# Patient Record
Sex: Male | Born: 1945 | Race: White | Hispanic: No | Marital: Married | Smoking: Never smoker
Health system: Southern US, Community
[De-identification: ages and names within clinical notes are randomized; demographics above are authoritative.]

## PROBLEM LIST (undated history)

## (undated) DIAGNOSIS — M1990 Unspecified osteoarthritis, unspecified site: Secondary | ICD-10-CM

## (undated) DIAGNOSIS — I10 Essential (primary) hypertension: Secondary | ICD-10-CM

## (undated) DIAGNOSIS — N400 Enlarged prostate without lower urinary tract symptoms: Secondary | ICD-10-CM

## (undated) DIAGNOSIS — E119 Type 2 diabetes mellitus without complications: Secondary | ICD-10-CM

## (undated) DIAGNOSIS — I77810 Thoracic aortic ectasia: Secondary | ICD-10-CM

## (undated) DIAGNOSIS — E785 Hyperlipidemia, unspecified: Secondary | ICD-10-CM

## (undated) DIAGNOSIS — K449 Diaphragmatic hernia without obstruction or gangrene: Secondary | ICD-10-CM

## (undated) DIAGNOSIS — I5030 Unspecified diastolic (congestive) heart failure: Secondary | ICD-10-CM

## (undated) DIAGNOSIS — I2510 Atherosclerotic heart disease of native coronary artery without angina pectoris: Secondary | ICD-10-CM

## (undated) DIAGNOSIS — I209 Angina pectoris, unspecified: Secondary | ICD-10-CM

## (undated) DIAGNOSIS — E1142 Type 2 diabetes mellitus with diabetic polyneuropathy: Secondary | ICD-10-CM

## (undated) DIAGNOSIS — I700 Atherosclerosis of aorta: Secondary | ICD-10-CM

## (undated) DIAGNOSIS — Z7982 Long term (current) use of aspirin: Secondary | ICD-10-CM

## (undated) DIAGNOSIS — R0602 Shortness of breath: Secondary | ICD-10-CM

## (undated) DIAGNOSIS — Z789 Other specified health status: Secondary | ICD-10-CM

## (undated) DIAGNOSIS — A419 Sepsis, unspecified organism: Secondary | ICD-10-CM

## (undated) HISTORY — PX: KNEE SURGERY: SHX244

## (undated) HISTORY — PX: MUSCLE REPAIR: SHX867

## (undated) HISTORY — PX: OTHER SURGICAL HISTORY: SHX169

## (undated) HISTORY — PX: CATARACT EXTRACTION, BILATERAL: SHX1313

## (undated) HISTORY — PX: BACK SURGERY: SHX140

## (undated) HISTORY — PX: COLONOSCOPY: SHX174

---

## 2009-03-26 DEATH — deceased

## 2011-06-28 DIAGNOSIS — M7530 Calcific tendinitis of unspecified shoulder: Secondary | ICD-10-CM | POA: Diagnosis not present

## 2011-07-28 DIAGNOSIS — S43429A Sprain of unspecified rotator cuff capsule, initial encounter: Secondary | ICD-10-CM | POA: Diagnosis not present

## 2011-08-02 DIAGNOSIS — M7530 Calcific tendinitis of unspecified shoulder: Secondary | ICD-10-CM | POA: Diagnosis not present

## 2011-08-04 DIAGNOSIS — M7530 Calcific tendinitis of unspecified shoulder: Secondary | ICD-10-CM | POA: Diagnosis not present

## 2011-08-09 DIAGNOSIS — M7530 Calcific tendinitis of unspecified shoulder: Secondary | ICD-10-CM | POA: Diagnosis not present

## 2011-08-11 DIAGNOSIS — M7530 Calcific tendinitis of unspecified shoulder: Secondary | ICD-10-CM | POA: Diagnosis not present

## 2011-08-14 DIAGNOSIS — M7530 Calcific tendinitis of unspecified shoulder: Secondary | ICD-10-CM | POA: Diagnosis not present

## 2011-10-03 DIAGNOSIS — H40019 Open angle with borderline findings, low risk, unspecified eye: Secondary | ICD-10-CM | POA: Diagnosis not present

## 2011-12-04 DIAGNOSIS — R5381 Other malaise: Secondary | ICD-10-CM | POA: Diagnosis not present

## 2011-12-04 DIAGNOSIS — R5383 Other fatigue: Secondary | ICD-10-CM | POA: Diagnosis not present

## 2011-12-04 DIAGNOSIS — E119 Type 2 diabetes mellitus without complications: Secondary | ICD-10-CM | POA: Diagnosis not present

## 2011-12-04 DIAGNOSIS — E039 Hypothyroidism, unspecified: Secondary | ICD-10-CM | POA: Diagnosis not present

## 2011-12-04 DIAGNOSIS — E782 Mixed hyperlipidemia: Secondary | ICD-10-CM | POA: Diagnosis not present

## 2011-12-11 DIAGNOSIS — E119 Type 2 diabetes mellitus without complications: Secondary | ICD-10-CM | POA: Diagnosis not present

## 2011-12-11 DIAGNOSIS — N529 Male erectile dysfunction, unspecified: Secondary | ICD-10-CM | POA: Diagnosis not present

## 2011-12-11 DIAGNOSIS — E782 Mixed hyperlipidemia: Secondary | ICD-10-CM | POA: Diagnosis not present

## 2011-12-11 DIAGNOSIS — L719 Rosacea, unspecified: Secondary | ICD-10-CM | POA: Diagnosis not present

## 2012-03-27 DIAGNOSIS — E119 Type 2 diabetes mellitus without complications: Secondary | ICD-10-CM | POA: Diagnosis not present

## 2012-03-27 DIAGNOSIS — H40019 Open angle with borderline findings, low risk, unspecified eye: Secondary | ICD-10-CM | POA: Diagnosis not present

## 2012-03-27 DIAGNOSIS — H2589 Other age-related cataract: Secondary | ICD-10-CM | POA: Diagnosis not present

## 2012-04-17 DIAGNOSIS — E291 Testicular hypofunction: Secondary | ICD-10-CM | POA: Diagnosis not present

## 2012-05-30 DIAGNOSIS — E119 Type 2 diabetes mellitus without complications: Secondary | ICD-10-CM | POA: Diagnosis not present

## 2012-05-30 DIAGNOSIS — R5381 Other malaise: Secondary | ICD-10-CM | POA: Diagnosis not present

## 2012-05-30 DIAGNOSIS — E782 Mixed hyperlipidemia: Secondary | ICD-10-CM | POA: Diagnosis not present

## 2012-06-06 DIAGNOSIS — E119 Type 2 diabetes mellitus without complications: Secondary | ICD-10-CM | POA: Diagnosis not present

## 2012-06-06 DIAGNOSIS — L719 Rosacea, unspecified: Secondary | ICD-10-CM | POA: Diagnosis not present

## 2012-06-06 DIAGNOSIS — E782 Mixed hyperlipidemia: Secondary | ICD-10-CM | POA: Diagnosis not present

## 2012-06-06 DIAGNOSIS — N529 Male erectile dysfunction, unspecified: Secondary | ICD-10-CM | POA: Diagnosis not present

## 2012-11-14 DIAGNOSIS — N529 Male erectile dysfunction, unspecified: Secondary | ICD-10-CM | POA: Diagnosis not present

## 2012-11-14 DIAGNOSIS — E782 Mixed hyperlipidemia: Secondary | ICD-10-CM | POA: Diagnosis not present

## 2012-11-14 DIAGNOSIS — L719 Rosacea, unspecified: Secondary | ICD-10-CM | POA: Diagnosis not present

## 2012-11-14 DIAGNOSIS — E119 Type 2 diabetes mellitus without complications: Secondary | ICD-10-CM | POA: Diagnosis not present

## 2012-11-19 DIAGNOSIS — E782 Mixed hyperlipidemia: Secondary | ICD-10-CM | POA: Diagnosis not present

## 2012-11-26 DIAGNOSIS — L719 Rosacea, unspecified: Secondary | ICD-10-CM | POA: Diagnosis not present

## 2012-11-26 DIAGNOSIS — N529 Male erectile dysfunction, unspecified: Secondary | ICD-10-CM | POA: Diagnosis not present

## 2012-11-26 DIAGNOSIS — E782 Mixed hyperlipidemia: Secondary | ICD-10-CM | POA: Diagnosis not present

## 2012-11-26 DIAGNOSIS — E119 Type 2 diabetes mellitus without complications: Secondary | ICD-10-CM | POA: Diagnosis not present

## 2013-04-04 DIAGNOSIS — L821 Other seborrheic keratosis: Secondary | ICD-10-CM | POA: Diagnosis not present

## 2013-04-04 DIAGNOSIS — D485 Neoplasm of uncertain behavior of skin: Secondary | ICD-10-CM | POA: Diagnosis not present

## 2013-04-04 DIAGNOSIS — L570 Actinic keratosis: Secondary | ICD-10-CM | POA: Diagnosis not present

## 2013-04-04 DIAGNOSIS — D2330 Other benign neoplasm of skin of unspecified part of face: Secondary | ICD-10-CM | POA: Diagnosis not present

## 2013-04-04 DIAGNOSIS — D1801 Hemangioma of skin and subcutaneous tissue: Secondary | ICD-10-CM | POA: Diagnosis not present

## 2013-04-04 DIAGNOSIS — D2360 Other benign neoplasm of skin of unspecified upper limb, including shoulder: Secondary | ICD-10-CM | POA: Diagnosis not present

## 2013-04-04 DIAGNOSIS — D235 Other benign neoplasm of skin of trunk: Secondary | ICD-10-CM | POA: Diagnosis not present

## 2013-04-18 DIAGNOSIS — Z23 Encounter for immunization: Secondary | ICD-10-CM | POA: Diagnosis not present

## 2013-05-21 DIAGNOSIS — E119 Type 2 diabetes mellitus without complications: Secondary | ICD-10-CM | POA: Diagnosis not present

## 2013-05-21 DIAGNOSIS — E782 Mixed hyperlipidemia: Secondary | ICD-10-CM | POA: Diagnosis not present

## 2013-05-21 DIAGNOSIS — R5381 Other malaise: Secondary | ICD-10-CM | POA: Diagnosis not present

## 2013-05-29 DIAGNOSIS — N529 Male erectile dysfunction, unspecified: Secondary | ICD-10-CM | POA: Diagnosis not present

## 2013-05-29 DIAGNOSIS — E119 Type 2 diabetes mellitus without complications: Secondary | ICD-10-CM | POA: Diagnosis not present

## 2013-05-29 DIAGNOSIS — L719 Rosacea, unspecified: Secondary | ICD-10-CM | POA: Diagnosis not present

## 2013-05-29 DIAGNOSIS — E782 Mixed hyperlipidemia: Secondary | ICD-10-CM | POA: Diagnosis not present

## 2013-06-06 DIAGNOSIS — R059 Cough, unspecified: Secondary | ICD-10-CM | POA: Diagnosis not present

## 2013-06-06 DIAGNOSIS — J019 Acute sinusitis, unspecified: Secondary | ICD-10-CM | POA: Diagnosis not present

## 2013-09-08 DIAGNOSIS — H2510 Age-related nuclear cataract, unspecified eye: Secondary | ICD-10-CM | POA: Diagnosis not present

## 2013-09-08 DIAGNOSIS — H40019 Open angle with borderline findings, low risk, unspecified eye: Secondary | ICD-10-CM | POA: Diagnosis not present

## 2013-09-08 DIAGNOSIS — E119 Type 2 diabetes mellitus without complications: Secondary | ICD-10-CM | POA: Diagnosis not present

## 2013-12-03 DIAGNOSIS — R5381 Other malaise: Secondary | ICD-10-CM | POA: Diagnosis not present

## 2013-12-03 DIAGNOSIS — E119 Type 2 diabetes mellitus without complications: Secondary | ICD-10-CM | POA: Diagnosis not present

## 2013-12-03 DIAGNOSIS — R5383 Other fatigue: Secondary | ICD-10-CM | POA: Diagnosis not present

## 2013-12-03 DIAGNOSIS — N529 Male erectile dysfunction, unspecified: Secondary | ICD-10-CM | POA: Diagnosis not present

## 2013-12-03 DIAGNOSIS — E782 Mixed hyperlipidemia: Secondary | ICD-10-CM | POA: Diagnosis not present

## 2013-12-10 DIAGNOSIS — E119 Type 2 diabetes mellitus without complications: Secondary | ICD-10-CM | POA: Diagnosis not present

## 2013-12-10 DIAGNOSIS — N529 Male erectile dysfunction, unspecified: Secondary | ICD-10-CM | POA: Diagnosis not present

## 2013-12-10 DIAGNOSIS — L719 Rosacea, unspecified: Secondary | ICD-10-CM | POA: Diagnosis not present

## 2013-12-10 DIAGNOSIS — G4700 Insomnia, unspecified: Secondary | ICD-10-CM | POA: Diagnosis not present

## 2013-12-10 DIAGNOSIS — E782 Mixed hyperlipidemia: Secondary | ICD-10-CM | POA: Diagnosis not present

## 2014-05-25 DIAGNOSIS — E119 Type 2 diabetes mellitus without complications: Secondary | ICD-10-CM | POA: Diagnosis not present

## 2014-05-25 DIAGNOSIS — R5383 Other fatigue: Secondary | ICD-10-CM | POA: Diagnosis not present

## 2014-05-25 DIAGNOSIS — E782 Mixed hyperlipidemia: Secondary | ICD-10-CM | POA: Diagnosis not present

## 2014-06-01 DIAGNOSIS — E782 Mixed hyperlipidemia: Secondary | ICD-10-CM | POA: Diagnosis not present

## 2014-06-01 DIAGNOSIS — L719 Rosacea, unspecified: Secondary | ICD-10-CM | POA: Diagnosis not present

## 2014-06-01 DIAGNOSIS — E1165 Type 2 diabetes mellitus with hyperglycemia: Secondary | ICD-10-CM | POA: Diagnosis not present

## 2014-06-01 DIAGNOSIS — Z1389 Encounter for screening for other disorder: Secondary | ICD-10-CM | POA: Diagnosis not present

## 2014-08-04 DIAGNOSIS — Z23 Encounter for immunization: Secondary | ICD-10-CM | POA: Diagnosis not present

## 2014-08-14 DIAGNOSIS — R935 Abnormal findings on diagnostic imaging of other abdominal regions, including retroperitoneum: Secondary | ICD-10-CM | POA: Diagnosis not present

## 2014-08-14 DIAGNOSIS — R109 Unspecified abdominal pain: Secondary | ICD-10-CM | POA: Diagnosis not present

## 2014-08-14 DIAGNOSIS — M545 Low back pain: Secondary | ICD-10-CM | POA: Diagnosis not present

## 2014-08-14 DIAGNOSIS — N4289 Other specified disorders of prostate: Secondary | ICD-10-CM | POA: Diagnosis not present

## 2014-08-17 DIAGNOSIS — M545 Low back pain: Secondary | ICD-10-CM | POA: Diagnosis not present

## 2014-08-17 DIAGNOSIS — N200 Calculus of kidney: Secondary | ICD-10-CM | POA: Diagnosis not present

## 2014-09-08 DIAGNOSIS — M5136 Other intervertebral disc degeneration, lumbar region: Secondary | ICD-10-CM | POA: Diagnosis not present

## 2014-09-08 DIAGNOSIS — S3991XA Unspecified injury of abdomen, initial encounter: Secondary | ICD-10-CM | POA: Diagnosis not present

## 2014-09-08 DIAGNOSIS — M25551 Pain in right hip: Secondary | ICD-10-CM | POA: Diagnosis not present

## 2014-09-08 DIAGNOSIS — N419 Inflammatory disease of prostate, unspecified: Secondary | ICD-10-CM | POA: Diagnosis not present

## 2014-09-11 DIAGNOSIS — H40023 Open angle with borderline findings, high risk, bilateral: Secondary | ICD-10-CM | POA: Diagnosis not present

## 2014-09-11 DIAGNOSIS — H2513 Age-related nuclear cataract, bilateral: Secondary | ICD-10-CM | POA: Diagnosis not present

## 2014-09-11 DIAGNOSIS — E119 Type 2 diabetes mellitus without complications: Secondary | ICD-10-CM | POA: Diagnosis not present

## 2014-09-23 DIAGNOSIS — L209 Atopic dermatitis, unspecified: Secondary | ICD-10-CM | POA: Diagnosis not present

## 2014-10-05 DIAGNOSIS — M25551 Pain in right hip: Secondary | ICD-10-CM | POA: Diagnosis not present

## 2014-10-05 DIAGNOSIS — S3991XA Unspecified injury of abdomen, initial encounter: Secondary | ICD-10-CM | POA: Diagnosis not present

## 2014-10-05 DIAGNOSIS — N419 Inflammatory disease of prostate, unspecified: Secondary | ICD-10-CM | POA: Diagnosis not present

## 2014-11-17 DIAGNOSIS — H40023 Open angle with borderline findings, high risk, bilateral: Secondary | ICD-10-CM | POA: Diagnosis not present

## 2014-11-25 DIAGNOSIS — R5383 Other fatigue: Secondary | ICD-10-CM | POA: Diagnosis not present

## 2014-11-25 DIAGNOSIS — E782 Mixed hyperlipidemia: Secondary | ICD-10-CM | POA: Diagnosis not present

## 2014-11-25 DIAGNOSIS — E119 Type 2 diabetes mellitus without complications: Secondary | ICD-10-CM | POA: Diagnosis not present

## 2014-12-09 DIAGNOSIS — E782 Mixed hyperlipidemia: Secondary | ICD-10-CM | POA: Diagnosis not present

## 2014-12-09 DIAGNOSIS — L719 Rosacea, unspecified: Secondary | ICD-10-CM | POA: Diagnosis not present

## 2014-12-09 DIAGNOSIS — E1165 Type 2 diabetes mellitus with hyperglycemia: Secondary | ICD-10-CM | POA: Diagnosis not present

## 2015-04-14 DIAGNOSIS — M25512 Pain in left shoulder: Secondary | ICD-10-CM | POA: Diagnosis not present

## 2015-05-14 DIAGNOSIS — M25412 Effusion, left shoulder: Secondary | ICD-10-CM | POA: Diagnosis not present

## 2015-05-14 DIAGNOSIS — M25512 Pain in left shoulder: Secondary | ICD-10-CM | POA: Diagnosis not present

## 2015-05-14 DIAGNOSIS — M75112 Incomplete rotator cuff tear or rupture of left shoulder, not specified as traumatic: Secondary | ICD-10-CM | POA: Diagnosis not present

## 2015-05-14 DIAGNOSIS — M19012 Primary osteoarthritis, left shoulder: Secondary | ICD-10-CM | POA: Diagnosis not present

## 2015-05-24 DIAGNOSIS — H40023 Open angle with borderline findings, high risk, bilateral: Secondary | ICD-10-CM | POA: Diagnosis not present

## 2015-06-07 DIAGNOSIS — E1165 Type 2 diabetes mellitus with hyperglycemia: Secondary | ICD-10-CM | POA: Diagnosis not present

## 2015-06-07 DIAGNOSIS — R5383 Other fatigue: Secondary | ICD-10-CM | POA: Diagnosis not present

## 2015-06-07 DIAGNOSIS — E782 Mixed hyperlipidemia: Secondary | ICD-10-CM | POA: Diagnosis not present

## 2015-06-11 DIAGNOSIS — M75112 Incomplete rotator cuff tear or rupture of left shoulder, not specified as traumatic: Secondary | ICD-10-CM | POA: Diagnosis not present

## 2015-06-18 DIAGNOSIS — E1165 Type 2 diabetes mellitus with hyperglycemia: Secondary | ICD-10-CM | POA: Diagnosis not present

## 2015-06-18 DIAGNOSIS — Z1389 Encounter for screening for other disorder: Secondary | ICD-10-CM | POA: Diagnosis not present

## 2015-06-18 DIAGNOSIS — L719 Rosacea, unspecified: Secondary | ICD-10-CM | POA: Diagnosis not present

## 2015-06-18 DIAGNOSIS — E782 Mixed hyperlipidemia: Secondary | ICD-10-CM | POA: Diagnosis not present

## 2015-07-09 DIAGNOSIS — M75112 Incomplete rotator cuff tear or rupture of left shoulder, not specified as traumatic: Secondary | ICD-10-CM | POA: Diagnosis not present

## 2015-09-23 DIAGNOSIS — E119 Type 2 diabetes mellitus without complications: Secondary | ICD-10-CM | POA: Diagnosis not present

## 2015-09-23 DIAGNOSIS — H2513 Age-related nuclear cataract, bilateral: Secondary | ICD-10-CM | POA: Diagnosis not present

## 2015-09-23 DIAGNOSIS — H40023 Open angle with borderline findings, high risk, bilateral: Secondary | ICD-10-CM | POA: Diagnosis not present

## 2015-12-10 DIAGNOSIS — L209 Atopic dermatitis, unspecified: Secondary | ICD-10-CM | POA: Diagnosis not present

## 2015-12-10 DIAGNOSIS — E782 Mixed hyperlipidemia: Secondary | ICD-10-CM | POA: Diagnosis not present

## 2015-12-10 DIAGNOSIS — E1165 Type 2 diabetes mellitus with hyperglycemia: Secondary | ICD-10-CM | POA: Diagnosis not present

## 2015-12-10 DIAGNOSIS — R5383 Other fatigue: Secondary | ICD-10-CM | POA: Diagnosis not present

## 2015-12-16 DIAGNOSIS — N529 Male erectile dysfunction, unspecified: Secondary | ICD-10-CM | POA: Diagnosis not present

## 2015-12-16 DIAGNOSIS — E782 Mixed hyperlipidemia: Secondary | ICD-10-CM | POA: Diagnosis not present

## 2015-12-16 DIAGNOSIS — E1165 Type 2 diabetes mellitus with hyperglycemia: Secondary | ICD-10-CM | POA: Diagnosis not present

## 2015-12-16 DIAGNOSIS — L719 Rosacea, unspecified: Secondary | ICD-10-CM | POA: Diagnosis not present

## 2016-01-04 DIAGNOSIS — B029 Zoster without complications: Secondary | ICD-10-CM | POA: Diagnosis not present

## 2016-01-04 DIAGNOSIS — Z6827 Body mass index (BMI) 27.0-27.9, adult: Secondary | ICD-10-CM | POA: Diagnosis not present

## 2016-03-24 DIAGNOSIS — H40023 Open angle with borderline findings, high risk, bilateral: Secondary | ICD-10-CM | POA: Diagnosis not present

## 2016-06-08 DIAGNOSIS — E782 Mixed hyperlipidemia: Secondary | ICD-10-CM | POA: Diagnosis not present

## 2016-06-08 DIAGNOSIS — E119 Type 2 diabetes mellitus without complications: Secondary | ICD-10-CM | POA: Diagnosis not present

## 2016-06-08 DIAGNOSIS — R5383 Other fatigue: Secondary | ICD-10-CM | POA: Diagnosis not present

## 2016-06-12 DIAGNOSIS — Z6828 Body mass index (BMI) 28.0-28.9, adult: Secondary | ICD-10-CM | POA: Diagnosis not present

## 2016-06-12 DIAGNOSIS — E782 Mixed hyperlipidemia: Secondary | ICD-10-CM | POA: Diagnosis not present

## 2016-06-12 DIAGNOSIS — E1165 Type 2 diabetes mellitus with hyperglycemia: Secondary | ICD-10-CM | POA: Diagnosis not present

## 2016-06-12 DIAGNOSIS — Z1212 Encounter for screening for malignant neoplasm of rectum: Secondary | ICD-10-CM | POA: Diagnosis not present

## 2016-06-12 DIAGNOSIS — L719 Rosacea, unspecified: Secondary | ICD-10-CM | POA: Diagnosis not present

## 2016-07-06 DIAGNOSIS — R69 Illness, unspecified: Secondary | ICD-10-CM | POA: Diagnosis not present

## 2016-07-07 DIAGNOSIS — R69 Illness, unspecified: Secondary | ICD-10-CM | POA: Diagnosis not present

## 2016-08-10 DIAGNOSIS — R05 Cough: Secondary | ICD-10-CM | POA: Diagnosis not present

## 2016-08-10 DIAGNOSIS — R0981 Nasal congestion: Secondary | ICD-10-CM | POA: Diagnosis not present

## 2016-09-29 DIAGNOSIS — H2513 Age-related nuclear cataract, bilateral: Secondary | ICD-10-CM | POA: Diagnosis not present

## 2016-09-29 DIAGNOSIS — E119 Type 2 diabetes mellitus without complications: Secondary | ICD-10-CM | POA: Diagnosis not present

## 2016-09-29 DIAGNOSIS — H40023 Open angle with borderline findings, high risk, bilateral: Secondary | ICD-10-CM | POA: Diagnosis not present

## 2016-10-05 DIAGNOSIS — R69 Illness, unspecified: Secondary | ICD-10-CM | POA: Diagnosis not present

## 2016-11-17 DIAGNOSIS — R69 Illness, unspecified: Secondary | ICD-10-CM | POA: Diagnosis not present

## 2016-11-23 DIAGNOSIS — R69 Illness, unspecified: Secondary | ICD-10-CM | POA: Diagnosis not present

## 2016-11-24 DIAGNOSIS — 419620001 Death: Secondary | SNOMED CT | POA: Diagnosis not present

## 2016-11-24 DEATH — deceased

## 2016-12-05 DIAGNOSIS — R5383 Other fatigue: Secondary | ICD-10-CM | POA: Diagnosis not present

## 2016-12-05 DIAGNOSIS — E1165 Type 2 diabetes mellitus with hyperglycemia: Secondary | ICD-10-CM | POA: Diagnosis not present

## 2016-12-05 DIAGNOSIS — L719 Rosacea, unspecified: Secondary | ICD-10-CM | POA: Diagnosis not present

## 2016-12-05 DIAGNOSIS — E782 Mixed hyperlipidemia: Secondary | ICD-10-CM | POA: Diagnosis not present

## 2016-12-10 DIAGNOSIS — R69 Illness, unspecified: Secondary | ICD-10-CM | POA: Diagnosis not present

## 2016-12-14 DIAGNOSIS — E1165 Type 2 diabetes mellitus with hyperglycemia: Secondary | ICD-10-CM | POA: Diagnosis not present

## 2016-12-14 DIAGNOSIS — E782 Mixed hyperlipidemia: Secondary | ICD-10-CM | POA: Diagnosis not present

## 2016-12-14 DIAGNOSIS — Z0000 Encounter for general adult medical examination without abnormal findings: Secondary | ICD-10-CM | POA: Diagnosis not present

## 2016-12-14 DIAGNOSIS — Z6827 Body mass index (BMI) 27.0-27.9, adult: Secondary | ICD-10-CM | POA: Diagnosis not present

## 2016-12-14 DIAGNOSIS — G4700 Insomnia, unspecified: Secondary | ICD-10-CM | POA: Diagnosis not present

## 2016-12-14 DIAGNOSIS — L719 Rosacea, unspecified: Secondary | ICD-10-CM | POA: Diagnosis not present

## 2016-12-19 DIAGNOSIS — M170 Bilateral primary osteoarthritis of knee: Secondary | ICD-10-CM | POA: Diagnosis not present

## 2016-12-19 DIAGNOSIS — M1712 Unilateral primary osteoarthritis, left knee: Secondary | ICD-10-CM | POA: Diagnosis not present

## 2016-12-19 DIAGNOSIS — M25562 Pain in left knee: Secondary | ICD-10-CM | POA: Diagnosis not present

## 2017-02-19 DIAGNOSIS — H40023 Open angle with borderline findings, high risk, bilateral: Secondary | ICD-10-CM | POA: Diagnosis not present

## 2017-03-13 ENCOUNTER — Emergency Department
Admission: EM | Admit: 2017-03-13 | Discharge: 2017-03-13 | Disposition: A | Discharge status: Home / Self Care | Payer: Medicare HMO | Attending: Emergency Medicine | Admitting: Emergency Medicine

## 2017-03-13 ENCOUNTER — Other Ambulatory Visit: Payer: Self-pay

## 2017-03-13 ENCOUNTER — Encounter: Payer: Self-pay | Admitting: Emergency Medicine

## 2017-03-13 ENCOUNTER — Emergency Department: Payer: Medicare HMO

## 2017-03-13 DIAGNOSIS — E119 Type 2 diabetes mellitus without complications: Secondary | ICD-10-CM | POA: Diagnosis not present

## 2017-03-13 DIAGNOSIS — I10 Essential (primary) hypertension: Secondary | ICD-10-CM | POA: Diagnosis not present

## 2017-03-13 DIAGNOSIS — R200 Anesthesia of skin: Secondary | ICD-10-CM | POA: Diagnosis present

## 2017-03-13 DIAGNOSIS — M21372 Foot drop, left foot: Secondary | ICD-10-CM

## 2017-03-13 DIAGNOSIS — R531 Weakness: Secondary | ICD-10-CM | POA: Diagnosis not present

## 2017-03-13 HISTORY — DX: Essential (primary) hypertension: I10

## 2017-03-13 HISTORY — DX: Type 2 diabetes mellitus without complications: E11.9

## 2017-03-13 NOTE — Discharge Instructions (Addendum)
Please make an appointment to follow-up with the orthopedic surgeon within 1 week for reevaluation. Return to the emergency department sooner for any concerns.  It was a pleasure to take care of you today, and thank you for coming to our emergency department.  If you have any questions or concerns before leaving please ask the nurse to grab me and I'm more than happy to go through your aftercare instructions again.  If you were prescribed any opioid pain medication today such as Norco, Vicodin, Percocet, morphine, hydrocodone, or oxycodone please make sure you do not drive when you are taking this medication as it can alter your ability to drive safely.  If you have any concerns once you are home that you are not improving or are in fact getting worse before you can make it to your follow-up appointment, please do not hesitate to call [phone] and come back for further evaluation.  Darel Hong, MD  No results found for this or any previous visit. Dg Tibia/fibula Left  Result Date: 03/13/2017 CLINICAL DATA:  Left leg weakness for 24 hours.  Left foot drop. EXAM: LEFT TIBIA AND FIBULA - 2 VIEW COMPARISON:  None. FINDINGS: No finding to suggest impingement along the peroneal nerve. No fracture or evidence of bone lesion. Mild arterial calcification. No acute soft tissue finding. IMPRESSION: No acute finding or explanation for the history. Electronically Signed   By: Monte Fantasia M.D.   On: 03/13/2017 20:14

## 2017-03-13 NOTE — ED Provider Notes (Signed)
[facility] Emergency Department Provider Note  ____________________________________________   First MD Initiated Contact with Patient 03/13/17 1947     (approximate)  I have reviewed the triage vital signs and the nursing notes.   HISTORY  Chief Complaint Numbness    HPI Martin Watts is a 71 y.o. male who self presents to the emergency Department with roughly 48 hours of atraumatic foot drop on the left.he has noted difficulty ambulating. He has no numbness in the left foot. He has not fallen. He denies hip pain. He denies chest pain or shortness of breath. His symptoms began insidiously and a been slowly progressive. He has not fallen.   Past Medical History:  Diagnosis Date  . Diabetes mellitus without complication ([place])   . Hypertension     There are no active problems to display for this patient.   Past Surgical History:  Procedure Laterality Date  . BACK SURGERY    . dental implant    . KNEE SURGERY Left   . shoulder sugery      Prior to Admission medications   Not on File    Allergies Patient has no known allergies.  No family history on file.  Social History Social History  Substance Use Topics  . Smoking status: Never Smoker  . Smokeless tobacco: Never Used  . Alcohol use No    Review of Systems Constitutional: No fever/chills ENT: No sore throat. Cardiovascular: Denies chest pain. Respiratory: Denies shortness of breath. Gastrointestinal: No abdominal pain.  No nausea, no vomiting.  No diarrhea.  No constipation. Musculoskeletal: Negative for back pain. Neurological: Negative for headaches   ____________________________________________   PHYSICAL EXAM:  VITAL SIGNS: ED Triage Vitals [03/13/17 1829]  Enc Vitals Group     BP (!) 156/81     Pulse Rate 93     Resp      Temp 99.1 F (37.3 C)     Temp Source Oral     SpO2 97 %     Weight 155 lb (70.3 kg)     Height 5\' 8"  (1.727 m)     Head Circumference       Peak Flow      Pain Score      Pain Loc      Pain Edu?      Excl. in [place]?     Constitutional: alert and oriented 4 well appearing nontoxic no diaphoresis speaks in full clear sentences Head: Atraumatic. Nose: No congestion/rhinnorhea. Mouth/Throat: No trismus Neck: No stridor.   Cardiovascular: regular rate and rhythm Respiratory: Normal respiratory effort.  No retractions. Musculoskeletal:  No tenderness over medial malleolus or lateral malleolus or for 6 cm proximal No tenderness over navicular, midfoot, or fifth metatarsal 2+ dorsalis pedis pulse Skin closed Compartments soft Patient can fire extensor hallucis longus, extensor digitorum longus, flexor hallucis longus, flexor digitorum longus, tibialis anterior, and gastrocnemius he has markedly decreased dorsiflexion on the left and is unable to get past about 90 Sensation intact to light touch to sural, saphenous, deep peroneal, superficial peroneal, and tibial nerve no tenderness of her proximal fibula  Neurologic:  Normal speech and language. No gross focal neurologic deficits are appreciated.  Skin:  Skin is warm, dry and intact. No rash noted.    ____________________________________________  LABS (all labs ordered are listed, but only abnormal results are displayed)  Labs Reviewed - No data to display   __________________________________________  EKG   ____________________________________________  RADIOLOGY  x-ray of the tibia  reviewed by me as negative ____________________________________________   PROCEDURES  Procedure(s) performed: no  Procedures  Critical Care performed: no  Observation: no ____________________________________________   INITIAL IMPRESSION / ASSESSMENT AND PLAN / ED COURSE  Pertinent labs & imaging results that were available during my care of the patient were reviewed by me and considered in my medical decision making (see chart for details).  The patient is very  well-appearing with an isolated foot drop on the left which is most consistent with a peroneal nerve injury. He is able to ambulate without difficulty. X-rays negative for acute pathology. I will refer him to orthopedic surgery as an outpatient for further management. He is discharged home in improved condition.      ____________________________________________   FINAL CLINICAL IMPRESSION(S) / ED DIAGNOSES  Final diagnoses:  Left foot drop      NEW MEDICATIONS STARTED DURING THIS VISIT:  There are no discharge medications for this patient.    Note:  This document was prepared using Dragon voice recognition software and may include unintentional dictation errors.      Darel Hong, MD 03/13/17 [phone]

## 2017-03-13 NOTE — ED Triage Notes (Signed)
Says left foot drop since 48 hr ago and left leg weak for 24 hours

## 2017-03-13 NOTE — ED Notes (Signed)
Pt refused post op boot and wanted to leave

## 2017-03-13 NOTE — ED Triage Notes (Signed)
FIRST NURSE NOTE-left hand numbness/tingling since yesterday morning. Sx for over 24 hrs. Ambulatory without difficulty. NAD

## 2017-03-16 DIAGNOSIS — E782 Mixed hyperlipidemia: Secondary | ICD-10-CM | POA: Diagnosis not present

## 2017-03-16 DIAGNOSIS — E1165 Type 2 diabetes mellitus with hyperglycemia: Secondary | ICD-10-CM | POA: Diagnosis not present

## 2017-03-16 DIAGNOSIS — R5383 Other fatigue: Secondary | ICD-10-CM | POA: Diagnosis not present

## 2017-03-23 DIAGNOSIS — E1165 Type 2 diabetes mellitus with hyperglycemia: Secondary | ICD-10-CM | POA: Diagnosis not present

## 2017-03-23 DIAGNOSIS — Z23 Encounter for immunization: Secondary | ICD-10-CM | POA: Diagnosis not present

## 2017-03-23 DIAGNOSIS — Z6828 Body mass index (BMI) 28.0-28.9, adult: Secondary | ICD-10-CM | POA: Diagnosis not present

## 2017-03-23 DIAGNOSIS — E782 Mixed hyperlipidemia: Secondary | ICD-10-CM | POA: Diagnosis not present

## 2017-03-23 DIAGNOSIS — L719 Rosacea, unspecified: Secondary | ICD-10-CM | POA: Diagnosis not present

## 2017-03-26 DIAGNOSIS — R29898 Other symptoms and signs involving the musculoskeletal system: Secondary | ICD-10-CM | POA: Diagnosis not present

## 2017-03-26 DIAGNOSIS — R200 Anesthesia of skin: Secondary | ICD-10-CM | POA: Diagnosis not present

## 2017-04-18 DIAGNOSIS — R69 Illness, unspecified: Secondary | ICD-10-CM | POA: Diagnosis not present

## 2017-04-24 DIAGNOSIS — G629 Polyneuropathy, unspecified: Secondary | ICD-10-CM | POA: Diagnosis not present

## 2017-06-01 DIAGNOSIS — R200 Anesthesia of skin: Secondary | ICD-10-CM | POA: Diagnosis not present

## 2017-06-01 DIAGNOSIS — R29898 Other symptoms and signs involving the musculoskeletal system: Secondary | ICD-10-CM | POA: Diagnosis not present

## 2017-06-01 DIAGNOSIS — G588 Other specified mononeuropathies: Secondary | ICD-10-CM | POA: Diagnosis not present

## 2017-06-01 DIAGNOSIS — M21372 Foot drop, left foot: Secondary | ICD-10-CM | POA: Diagnosis not present

## 2017-06-06 ENCOUNTER — Other Ambulatory Visit: Payer: Self-pay | Admitting: Sports Medicine

## 2017-06-06 DIAGNOSIS — R29898 Other symptoms and signs involving the musculoskeletal system: Secondary | ICD-10-CM

## 2017-06-07 DIAGNOSIS — R69 Illness, unspecified: Secondary | ICD-10-CM | POA: Diagnosis not present

## 2017-06-15 DIAGNOSIS — D225 Melanocytic nevi of trunk: Secondary | ICD-10-CM | POA: Diagnosis not present

## 2017-06-15 DIAGNOSIS — D2261 Melanocytic nevi of right upper limb, including shoulder: Secondary | ICD-10-CM | POA: Diagnosis not present

## 2017-06-15 DIAGNOSIS — D2272 Melanocytic nevi of left lower limb, including hip: Secondary | ICD-10-CM | POA: Diagnosis not present

## 2017-06-15 DIAGNOSIS — L821 Other seborrheic keratosis: Secondary | ICD-10-CM | POA: Diagnosis not present

## 2017-06-29 ENCOUNTER — Ambulatory Visit
Admission: RE | Admit: 2017-06-29 | Discharge: 2017-06-29 | Disposition: A | Discharge status: Home / Self Care | Payer: Medicare HMO | Source: Ambulatory Visit | Attending: Sports Medicine | Admitting: Sports Medicine

## 2017-06-29 DIAGNOSIS — R29898 Other symptoms and signs involving the musculoskeletal system: Secondary | ICD-10-CM | POA: Diagnosis not present

## 2017-06-29 DIAGNOSIS — M5126 Other intervertebral disc displacement, lumbar region: Secondary | ICD-10-CM | POA: Diagnosis not present

## 2017-06-29 DIAGNOSIS — M48061 Spinal stenosis, lumbar region without neurogenic claudication: Secondary | ICD-10-CM | POA: Diagnosis not present

## 2017-06-29 DIAGNOSIS — M2578 Osteophyte, vertebrae: Secondary | ICD-10-CM | POA: Insufficient documentation

## 2017-07-03 DIAGNOSIS — G588 Other specified mononeuropathies: Secondary | ICD-10-CM | POA: Diagnosis not present

## 2017-07-03 DIAGNOSIS — R200 Anesthesia of skin: Secondary | ICD-10-CM | POA: Diagnosis not present

## 2017-07-03 DIAGNOSIS — R29898 Other symptoms and signs involving the musculoskeletal system: Secondary | ICD-10-CM | POA: Diagnosis not present

## 2017-07-03 DIAGNOSIS — M21372 Foot drop, left foot: Secondary | ICD-10-CM | POA: Diagnosis not present

## 2017-07-05 DIAGNOSIS — R69 Illness, unspecified: Secondary | ICD-10-CM | POA: Diagnosis not present

## 2017-07-30 DIAGNOSIS — R69 Illness, unspecified: Secondary | ICD-10-CM | POA: Diagnosis not present

## 2017-07-30 DIAGNOSIS — R5383 Other fatigue: Secondary | ICD-10-CM | POA: Diagnosis not present

## 2017-07-30 DIAGNOSIS — E782 Mixed hyperlipidemia: Secondary | ICD-10-CM | POA: Diagnosis not present

## 2017-07-30 DIAGNOSIS — E1165 Type 2 diabetes mellitus with hyperglycemia: Secondary | ICD-10-CM | POA: Diagnosis not present

## 2017-08-06 DIAGNOSIS — E782 Mixed hyperlipidemia: Secondary | ICD-10-CM | POA: Diagnosis not present

## 2017-08-06 DIAGNOSIS — L719 Rosacea, unspecified: Secondary | ICD-10-CM | POA: Diagnosis not present

## 2017-08-06 DIAGNOSIS — E1165 Type 2 diabetes mellitus with hyperglycemia: Secondary | ICD-10-CM | POA: Diagnosis not present

## 2017-08-06 DIAGNOSIS — Z6828 Body mass index (BMI) 28.0-28.9, adult: Secondary | ICD-10-CM | POA: Diagnosis not present

## 2017-08-12 DIAGNOSIS — R69 Illness, unspecified: Secondary | ICD-10-CM | POA: Diagnosis not present

## 2017-09-06 DIAGNOSIS — R69 Illness, unspecified: Secondary | ICD-10-CM | POA: Diagnosis not present

## 2017-09-11 DIAGNOSIS — H40023 Open angle with borderline findings, high risk, bilateral: Secondary | ICD-10-CM | POA: Diagnosis not present

## 2017-10-05 DIAGNOSIS — M25541 Pain in joints of right hand: Secondary | ICD-10-CM | POA: Diagnosis not present

## 2017-10-05 DIAGNOSIS — M19041 Primary osteoarthritis, right hand: Secondary | ICD-10-CM | POA: Diagnosis not present

## 2017-10-05 DIAGNOSIS — M65341 Trigger finger, right ring finger: Secondary | ICD-10-CM | POA: Diagnosis not present

## 2017-10-06 DIAGNOSIS — R69 Illness, unspecified: Secondary | ICD-10-CM | POA: Diagnosis not present

## 2017-10-14 DIAGNOSIS — R69 Illness, unspecified: Secondary | ICD-10-CM | POA: Diagnosis not present

## 2017-11-01 DIAGNOSIS — E782 Mixed hyperlipidemia: Secondary | ICD-10-CM | POA: Diagnosis not present

## 2017-11-01 DIAGNOSIS — R5383 Other fatigue: Secondary | ICD-10-CM | POA: Diagnosis not present

## 2017-11-01 DIAGNOSIS — E1165 Type 2 diabetes mellitus with hyperglycemia: Secondary | ICD-10-CM | POA: Diagnosis not present

## 2017-11-01 DIAGNOSIS — N529 Male erectile dysfunction, unspecified: Secondary | ICD-10-CM | POA: Diagnosis not present

## 2017-11-02 DIAGNOSIS — R69 Illness, unspecified: Secondary | ICD-10-CM | POA: Diagnosis not present

## 2017-11-08 DIAGNOSIS — E1165 Type 2 diabetes mellitus with hyperglycemia: Secondary | ICD-10-CM | POA: Diagnosis not present

## 2017-11-08 DIAGNOSIS — L719 Rosacea, unspecified: Secondary | ICD-10-CM | POA: Diagnosis not present

## 2017-11-08 DIAGNOSIS — Z6826 Body mass index (BMI) 26.0-26.9, adult: Secondary | ICD-10-CM | POA: Diagnosis not present

## 2017-11-08 DIAGNOSIS — E782 Mixed hyperlipidemia: Secondary | ICD-10-CM | POA: Diagnosis not present

## 2017-12-09 DIAGNOSIS — R69 Illness, unspecified: Secondary | ICD-10-CM | POA: Diagnosis not present

## 2017-12-28 DIAGNOSIS — M25541 Pain in joints of right hand: Secondary | ICD-10-CM | POA: Diagnosis not present

## 2017-12-28 DIAGNOSIS — M65341 Trigger finger, right ring finger: Secondary | ICD-10-CM | POA: Diagnosis not present

## 2018-01-04 DIAGNOSIS — H0014 Chalazion left upper eyelid: Secondary | ICD-10-CM | POA: Diagnosis not present

## 2018-01-04 DIAGNOSIS — H40023 Open angle with borderline findings, high risk, bilateral: Secondary | ICD-10-CM | POA: Diagnosis not present

## 2018-01-04 DIAGNOSIS — H2513 Age-related nuclear cataract, bilateral: Secondary | ICD-10-CM | POA: Diagnosis not present

## 2018-01-04 DIAGNOSIS — E119 Type 2 diabetes mellitus without complications: Secondary | ICD-10-CM | POA: Diagnosis not present

## 2018-01-29 DIAGNOSIS — E119 Type 2 diabetes mellitus without complications: Secondary | ICD-10-CM | POA: Diagnosis not present

## 2018-02-08 DIAGNOSIS — H0014 Chalazion left upper eyelid: Secondary | ICD-10-CM | POA: Diagnosis not present

## 2018-02-21 DIAGNOSIS — R69 Illness, unspecified: Secondary | ICD-10-CM | POA: Diagnosis not present

## 2018-04-12 DIAGNOSIS — M65341 Trigger finger, right ring finger: Secondary | ICD-10-CM | POA: Diagnosis not present

## 2018-04-16 IMAGING — MR MR LUMBAR SPINE W/O CM
5 series · 36 of 48 positions shown · non-contrast
Comparison: Abdominopelvic CT 08/14/2014.

CLINICAL DATA: Left leg numbness and foot drop for 4 months.
Previous back surgery in 0494. No known injury.

EXAM:
MRI LUMBAR SPINE WITHOUT CONTRAST
TECHNIQUE: Multiplanar, multisequence MR imaging of the lumbar spine was
performed. No intravenous contrast was administered.

[Series 2: T2 · sagittal · 4.0mm · 0.81mm/px · 6 of 15 slices shown (1 of 2)]
[im 1/15]
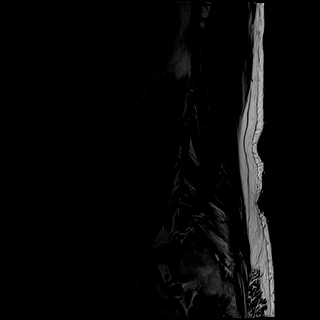
[im 3/15]
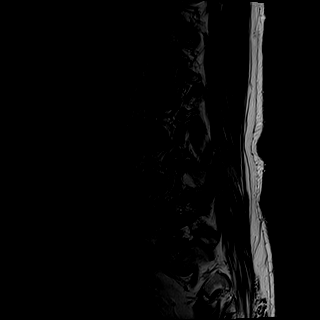
[im 6/15]
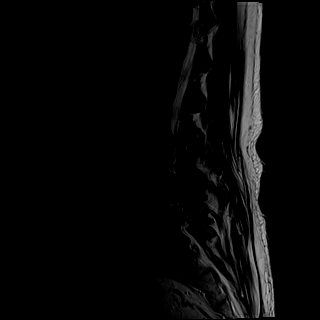
[im 9/15]
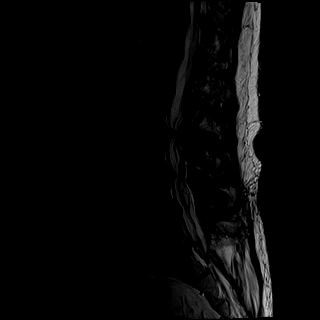
[im 12/15]
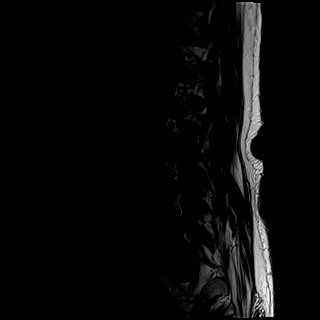
[im 15/15]
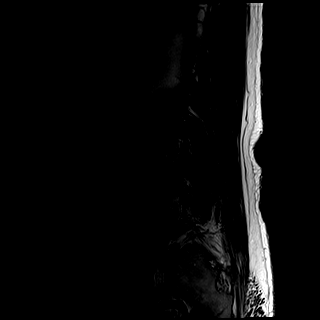

[Series 3: T1 · sagittal · 4.0mm · 0.81mm/px · 7 of 15 slices shown (1 of 2)]
[im 1/15]
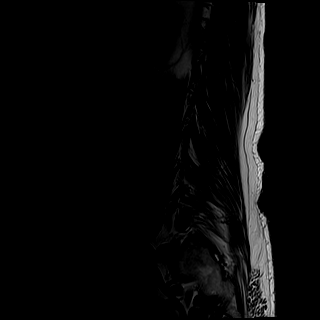
[im 3/15]
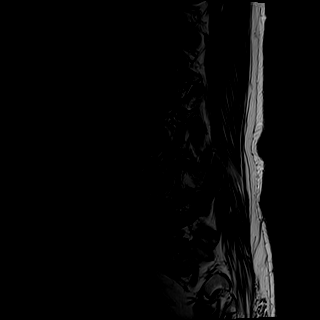
[im 5/15]
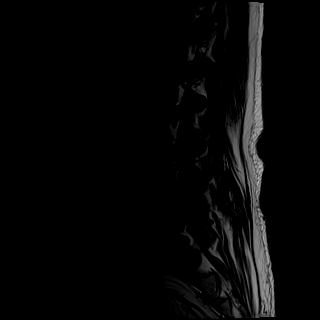
[im 8/15]
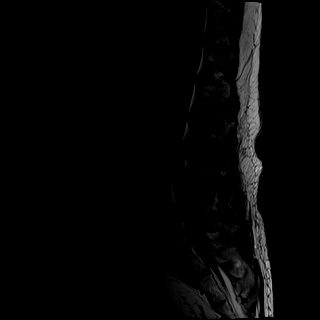
[im 10/15]
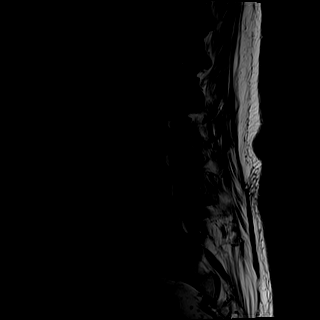
[im 12/15]
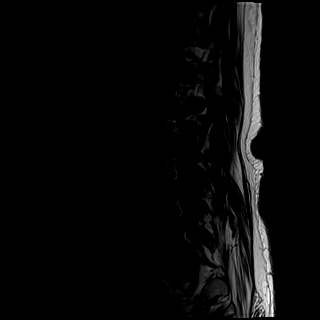
[im 15/15]
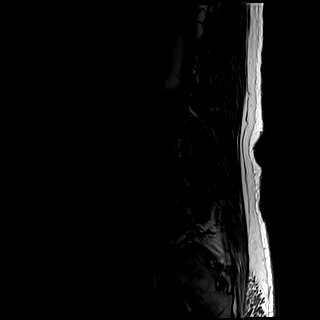

[Series 4: STIR · sagittal · 4.0mm · 1.02mm/px · 7 of 15 slices shown]
[im 1/15]
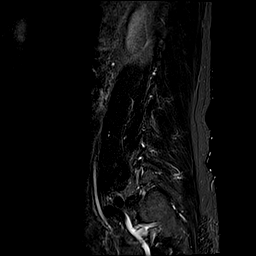
[im 3/15]
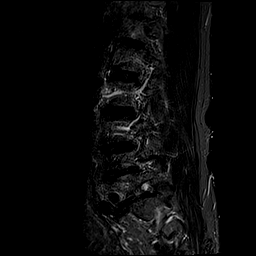
[im 5/15]
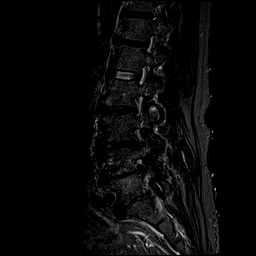
[im 8/15]
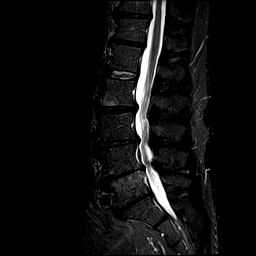
[im 10/15]
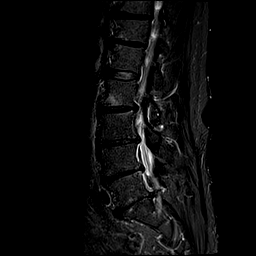
[im 12/15]
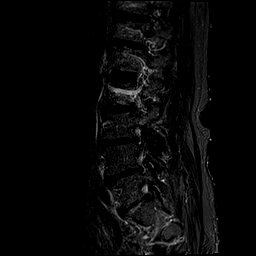
[im 15/15]
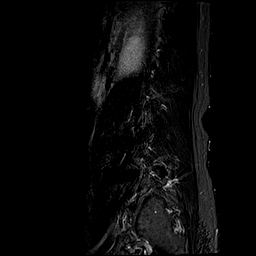

[Series 5: T2 · axial · 4.0mm · 0.78mm/px · z∈[-113,+62]mm · 8 of 31 slices shown (2 of 2)]
[im 1/31]
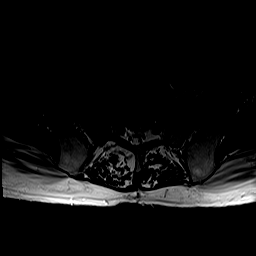
[im 5/31]
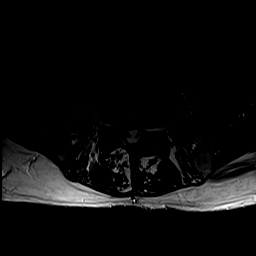
[im 10/31]
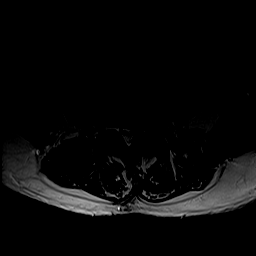
[im 14/31]
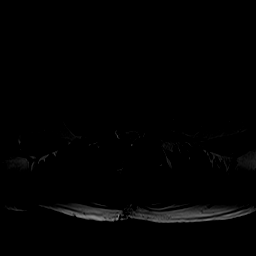
[im 17/31]
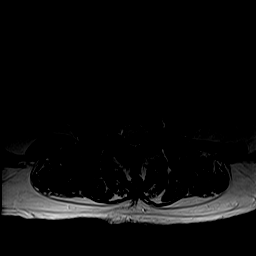
[im 21/31]
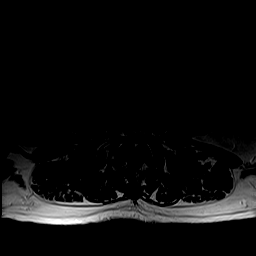
[im 26/31]
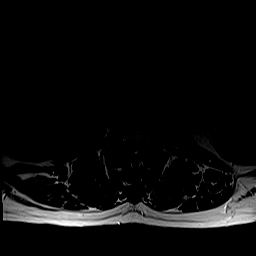
[im 31/31]
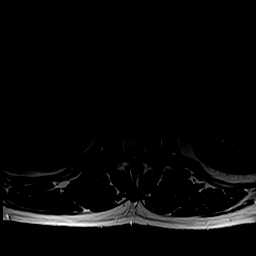

[Series 6: T1 · axial · 4.0mm · 0.39mm/px · z∈[-113,+62]mm · 8 of 31 slices shown (2 of 2)]
[im 1/31]
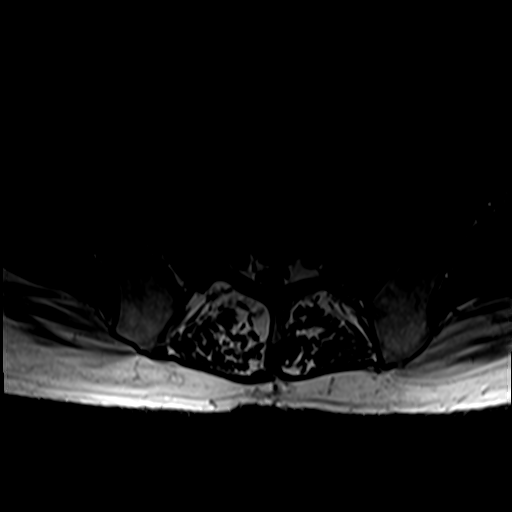
[im 5/31]
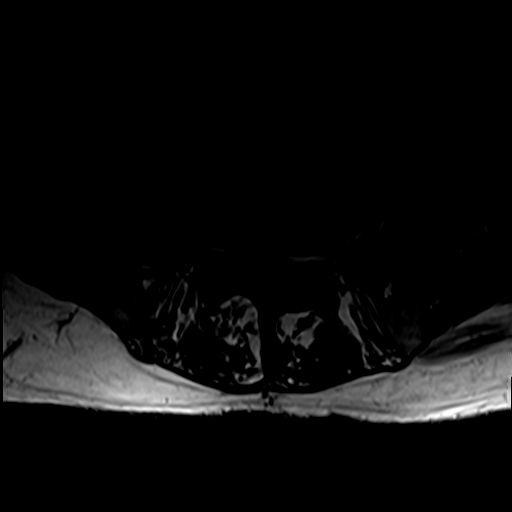
[im 10/31]
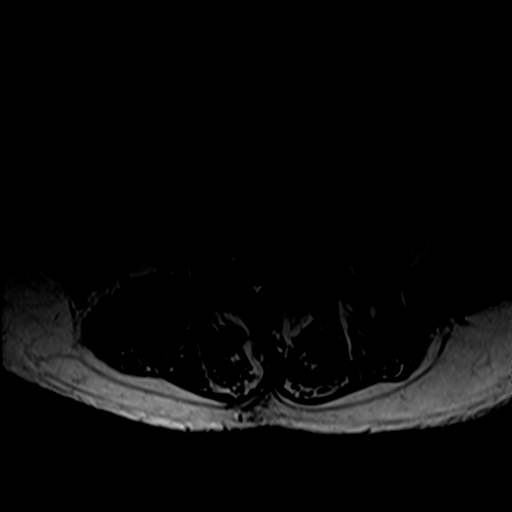
[im 14/31]
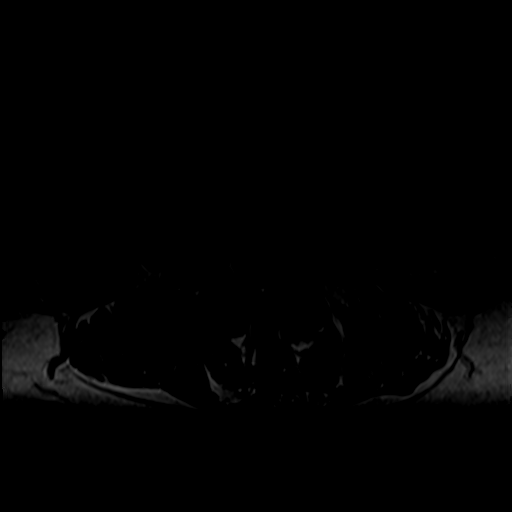
[im 17/31]
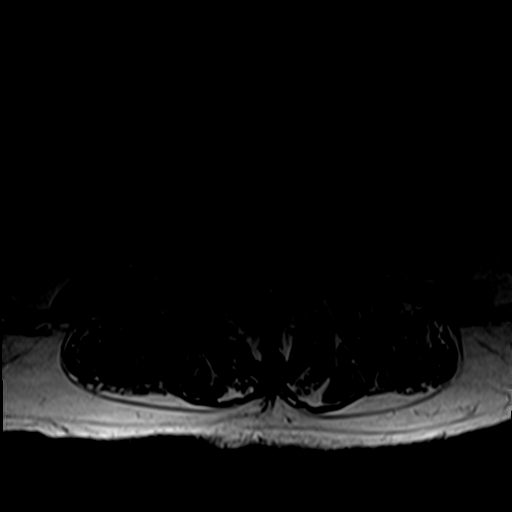
[im 21/31]
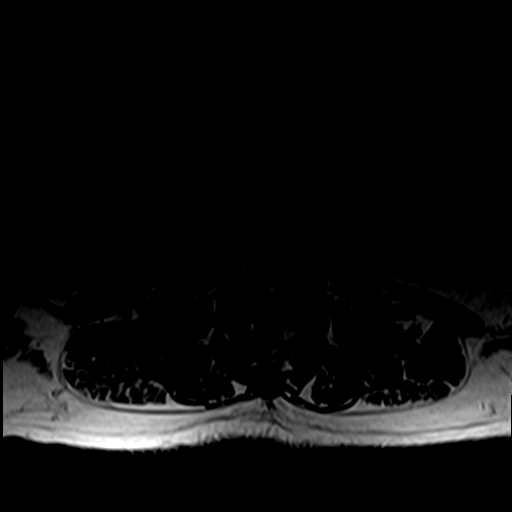
[im 26/31]
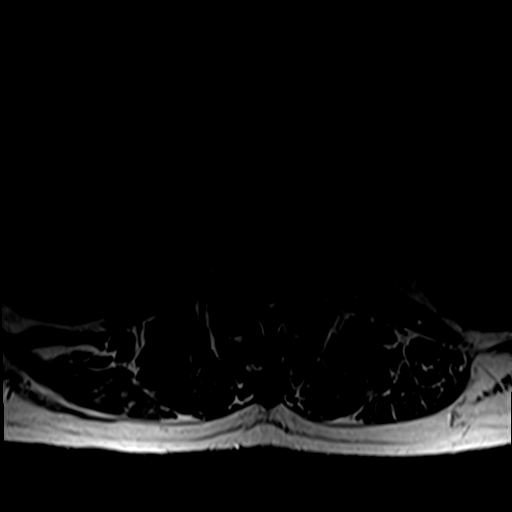
[im 31/31]
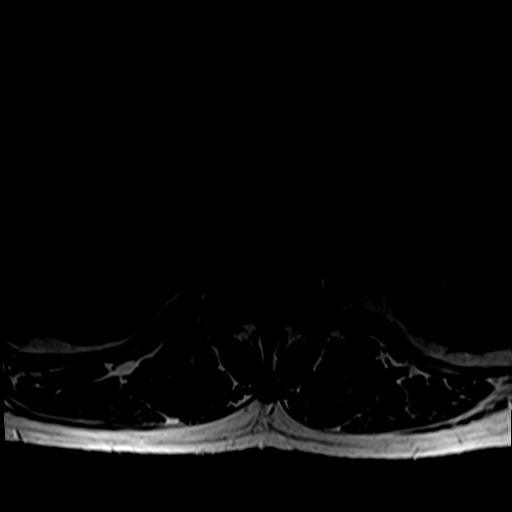

[36 of 48 positions shown; findings below may reference images not displayed]

FINDINGS: Segmentation: Conventional anatomy assumed, with the last open disc
space designated L5-S1.

Alignment: Stable minimal scoliosis. The lateral alignment is
normal.

Vertebrae: No worrisome osseous lesion, acute fracture or pars
defect. Scattered endplate degenerative changes, greatest at L4-5
and L5-S1. The lumbar pedicles are diffusely short on a congenital
basis. The visualized sacroiliac joints appear unremarkable.

Conus medullaris: Extends to the L1-2 level and appears normal.

Paraspinal and other soft tissues: No significant paraspinal
findings.

Disc levels:

Sagittal images demonstrate mild disc bulging at T11-12 and T12-L1.

L1-2:  Normal interspace.

L2-3: Annular disc bulging eccentric to the right with mild facet
and ligamentous hypertrophy. There is mild right-sided lateral
recess and foraminal narrowing. No evidence of left-sided nerve root
encroachment.

L3-4: Loss of disc height with annular disc bulging, facet and
ligamentous hypertrophy. These factors contribute to
mild-to-moderate spinal stenosis with mild lateral recess and
foraminal narrowing bilaterally. No evidence of left-sided nerve
root encroachment.

L4-5: Chronic loss of disc height with annular disc bulging and
endplate osteophytes asymmetric to the left. Mild facet and
ligamentous hypertrophy. These factors contribute to borderline
spinal stenosis and moderate left-sided foraminal narrowing. There
is possible left L4 nerve root encroachment.

L5-S1: Chronic loss of disc height with annular disc bulging and
endplate osteophytes asymmetric to the left. Probable postsurgical
changes on the right with mild scarring around the right S1 nerve
root. There is mild bilateral facet hypertrophy. No significant
spinal stenosis or foraminal narrowing. Left paraspinal osteophytes
could encroach on the extraforaminal portion of the left L5 nerve
root.
IMPRESSION: 1. Congenitally short pedicles.
2. Eccentric disc bulging on the right at L2-3 contributing to mild
right-sided lateral recess and foraminal narrowing. No evidence of
left-sided nerve root encroachment.
3. Mild to moderate multifactorial spinal stenosis at L3-4 without
nerve root encroachment.
4. Moderate left foraminal narrowing at L4-5 with possible left L4
nerve root encroachment. This seems to be the most likely
explanation for the patient's symptoms.
5. At L5-S1, there are asymmetric paraspinal osteophytes on the left
which could encroach on the extraforaminal portion of the left L5
nerve root.

## 2018-04-27 DIAGNOSIS — R69 Illness, unspecified: Secondary | ICD-10-CM | POA: Diagnosis not present

## 2018-04-29 DIAGNOSIS — R69 Illness, unspecified: Secondary | ICD-10-CM | POA: Diagnosis not present

## 2018-05-03 DIAGNOSIS — M65341 Trigger finger, right ring finger: Secondary | ICD-10-CM | POA: Diagnosis not present

## 2018-06-21 DIAGNOSIS — R69 Illness, unspecified: Secondary | ICD-10-CM | POA: Diagnosis not present

## 2018-06-28 DIAGNOSIS — E119 Type 2 diabetes mellitus without complications: Secondary | ICD-10-CM | POA: Diagnosis not present

## 2018-06-28 DIAGNOSIS — Z0000 Encounter for general adult medical examination without abnormal findings: Secondary | ICD-10-CM | POA: Diagnosis not present

## 2018-07-05 DIAGNOSIS — R69 Illness, unspecified: Secondary | ICD-10-CM | POA: Diagnosis not present

## 2018-07-05 DIAGNOSIS — E7800 Pure hypercholesterolemia, unspecified: Secondary | ICD-10-CM | POA: Diagnosis not present

## 2018-07-05 DIAGNOSIS — Z1211 Encounter for screening for malignant neoplasm of colon: Secondary | ICD-10-CM | POA: Diagnosis not present

## 2018-07-05 DIAGNOSIS — Z23 Encounter for immunization: Secondary | ICD-10-CM | POA: Diagnosis not present

## 2018-07-05 DIAGNOSIS — E1169 Type 2 diabetes mellitus with other specified complication: Secondary | ICD-10-CM | POA: Diagnosis not present

## 2018-07-05 DIAGNOSIS — Z0000 Encounter for general adult medical examination without abnormal findings: Secondary | ICD-10-CM | POA: Diagnosis not present

## 2018-07-05 DIAGNOSIS — E785 Hyperlipidemia, unspecified: Secondary | ICD-10-CM | POA: Diagnosis not present

## 2018-07-10 DIAGNOSIS — R69 Illness, unspecified: Secondary | ICD-10-CM | POA: Diagnosis not present

## 2018-08-02 ENCOUNTER — Other Ambulatory Visit: Payer: Self-pay | Admitting: Surgery

## 2018-08-02 DIAGNOSIS — M75122 Complete rotator cuff tear or rupture of left shoulder, not specified as traumatic: Secondary | ICD-10-CM

## 2018-08-02 DIAGNOSIS — M25512 Pain in left shoulder: Secondary | ICD-10-CM | POA: Diagnosis not present

## 2018-08-02 DIAGNOSIS — M7582 Other shoulder lesions, left shoulder: Secondary | ICD-10-CM | POA: Diagnosis not present

## 2018-08-09 DIAGNOSIS — H40013 Open angle with borderline findings, low risk, bilateral: Secondary | ICD-10-CM | POA: Diagnosis not present

## 2018-08-09 DIAGNOSIS — E119 Type 2 diabetes mellitus without complications: Secondary | ICD-10-CM | POA: Diagnosis not present

## 2018-08-09 DIAGNOSIS — H2513 Age-related nuclear cataract, bilateral: Secondary | ICD-10-CM | POA: Diagnosis not present

## 2018-08-11 ENCOUNTER — Ambulatory Visit
Admission: RE | Admit: 2018-08-11 | Discharge: 2018-08-11 | Disposition: A | Discharge status: Home / Self Care | Payer: Medicare HMO | Source: Ambulatory Visit | Attending: Surgery | Admitting: Surgery

## 2018-08-11 DIAGNOSIS — M75122 Complete rotator cuff tear or rupture of left shoulder, not specified as traumatic: Secondary | ICD-10-CM | POA: Diagnosis not present

## 2018-08-11 DIAGNOSIS — S46012A Strain of muscle(s) and tendon(s) of the rotator cuff of left shoulder, initial encounter: Secondary | ICD-10-CM | POA: Diagnosis not present

## 2018-08-23 DIAGNOSIS — M75122 Complete rotator cuff tear or rupture of left shoulder, not specified as traumatic: Secondary | ICD-10-CM | POA: Diagnosis not present

## 2018-08-23 DIAGNOSIS — M7582 Other shoulder lesions, left shoulder: Secondary | ICD-10-CM | POA: Diagnosis not present

## 2018-09-06 DIAGNOSIS — E1169 Type 2 diabetes mellitus with other specified complication: Secondary | ICD-10-CM | POA: Diagnosis not present

## 2018-09-06 DIAGNOSIS — E785 Hyperlipidemia, unspecified: Secondary | ICD-10-CM | POA: Diagnosis not present

## 2018-09-06 DIAGNOSIS — Z1211 Encounter for screening for malignant neoplasm of colon: Secondary | ICD-10-CM | POA: Diagnosis not present

## 2018-09-06 DIAGNOSIS — M75122 Complete rotator cuff tear or rupture of left shoulder, not specified as traumatic: Secondary | ICD-10-CM | POA: Diagnosis not present

## 2018-09-16 ENCOUNTER — Inpatient Hospital Stay: Admission: RE | Admit: 2018-09-16 | Payer: Medicare HMO | Source: Ambulatory Visit

## 2018-09-26 ENCOUNTER — Encounter: Admission: RE | Payer: Self-pay | Source: Home / Self Care

## 2018-09-26 ENCOUNTER — Inpatient Hospital Stay: Admission: RE | Admit: 2018-09-26 | Payer: Medicare HMO | Source: Home / Self Care | Admitting: Surgery

## 2018-09-26 SURGERY — ARTHROPLASTY, SHOULDER, TOTAL
Anesthesia: Choice | Laterality: Left

## 2018-10-08 DIAGNOSIS — R69 Illness, unspecified: Secondary | ICD-10-CM | POA: Diagnosis not present

## 2018-11-01 DIAGNOSIS — E1169 Type 2 diabetes mellitus with other specified complication: Secondary | ICD-10-CM | POA: Diagnosis not present

## 2018-11-01 DIAGNOSIS — E785 Hyperlipidemia, unspecified: Secondary | ICD-10-CM | POA: Diagnosis not present

## 2018-11-01 DIAGNOSIS — E7800 Pure hypercholesterolemia, unspecified: Secondary | ICD-10-CM | POA: Diagnosis not present

## 2018-11-08 DIAGNOSIS — R030 Elevated blood-pressure reading, without diagnosis of hypertension: Secondary | ICD-10-CM | POA: Diagnosis not present

## 2018-11-08 DIAGNOSIS — E7800 Pure hypercholesterolemia, unspecified: Secondary | ICD-10-CM | POA: Diagnosis not present

## 2018-11-08 DIAGNOSIS — E1169 Type 2 diabetes mellitus with other specified complication: Secondary | ICD-10-CM | POA: Diagnosis not present

## 2018-11-08 DIAGNOSIS — E785 Hyperlipidemia, unspecified: Secondary | ICD-10-CM | POA: Diagnosis not present

## 2018-11-25 ENCOUNTER — Other Ambulatory Visit: Payer: Medicare HMO

## 2018-11-26 ENCOUNTER — Other Ambulatory Visit: Payer: Self-pay

## 2018-11-26 ENCOUNTER — Encounter
Admission: RE | Admit: 2018-11-26 | Discharge: 2018-11-26 | Disposition: A | Discharge status: Home / Self Care | Payer: Medicare HMO | Source: Ambulatory Visit | Attending: Surgery | Admitting: Surgery

## 2018-11-26 DIAGNOSIS — Z1159 Encounter for screening for other viral diseases: Secondary | ICD-10-CM | POA: Insufficient documentation

## 2018-11-26 DIAGNOSIS — Z01818 Encounter for other preprocedural examination: Secondary | ICD-10-CM | POA: Diagnosis present

## 2018-11-26 DIAGNOSIS — E118 Type 2 diabetes mellitus with unspecified complications: Secondary | ICD-10-CM | POA: Insufficient documentation

## 2018-11-26 DIAGNOSIS — E119 Type 2 diabetes mellitus without complications: Secondary | ICD-10-CM | POA: Diagnosis not present

## 2018-11-26 HISTORY — DX: Unspecified osteoarthritis, unspecified site: M19.90

## 2018-11-26 LAB — URINALYSIS, ROUTINE W REFLEX MICROSCOPIC
Bacteria, UA: NONE SEEN
Bilirubin Urine: NEGATIVE
Glucose, UA: >=500 mg/dL — AB
Hgb urine dipstick: NEGATIVE
Ketones, ur: NEGATIVE mg/dL
Leukocytes,Ua: NEGATIVE
Nitrite: NEGATIVE
Protein, ur: NEGATIVE mg/dL
Specific Gravity, Urine: 1.008 (ref 1.005–1.030)
Squamous Epithelial / LPF: NONE SEEN (ref 0–5)
WBC, UA: NONE SEEN WBC/hpf (ref 0–5)
pH: 7 (ref 5.0–8.0)

## 2018-11-26 LAB — TYPE AND SCREEN
ABO/RH(D): A POS
Antibody Screen: NEGATIVE

## 2018-11-26 LAB — SURGICAL PCR SCREEN
MRSA, PCR: NEGATIVE
Staphylococcus aureus: POSITIVE — AB

## 2018-11-26 NOTE — Patient Instructions (Signed)
Your procedure is scheduled on: 12/03/18 Report to Day Surgery. To find out your arrival time please call [phone] between 1PM - 3PM on 11/29/18.  Remember: Instructions that are not followed completely may result in serious medical risk,  up to and including death, or upon the discretion of your surgeon and anesthesiologist your  surgery may need to be rescheduled.     _X__ 1. Do not eat food after midnight the night before your procedure.                 No gum chewing or hard candies. You may drink clear liquids up to 2 hours                 before you are scheduled to arrive for your surgery- DO not drink clear                 liquids within 2 hours of the start of your surgery.                 Clear Liquids include:  water, apple juice without pulp, clear carbohydrate                 drink such as Clearfast of Gatorade, Black Coffee or Tea (Do not add                 anything to coffee or tea).  __X__2.  On the morning of surgery brush your teeth with toothpaste and water, you                may rinse your mouth with mouthwash if you wish.  Do not swallow any toothpaste of mouthwash.     _X__ 3.  No Alcohol for 24 hours before or after surgery.   _X__ 4.  Do Not Smoke or use e-cigarettes For 24 Hours Prior to Your Surgery.                 Do not use any chewable tobacco products for at least 6 hours prior to                 surgery.  ____  5.  Bring all medications with you on the day of surgery if instructed.   _X___  6.  Notify your doctor if there is any change in your medical condition      (cold, fever, infections).     Do not wear jewelry, make-up, hairpins, clips or nail polish. Do not wear lotions, powders, or perfumes. You may wear deodorant. Do not shave 48 hours prior to surgery. Men may shave face and neck. Do not bring valuables to the hospital.    [facility] is not responsible for any belongings or valuables.  Contacts, dentures or  bridgework may not be worn into surgery. Leave your suitcase in the car. After surgery it may be brought to your room. For patients admitted to the hospital, discharge time is determined by your treatment team.   Patients discharged the day of surgery will not be allowed to drive home.   Please read over the following fact sheets that you were given: [place] / MRSA/ COVID           __X__ Take these medicines the morning of surgery with A SIP OF WATER:    1. EZETIMIBE  2.   3.   4.  5.  6.  ____ Fleet Enema (as directed)   __X__ Use CHG  Soap as directed  ____ Use inhalers on the day of surgery  _X___ Stop metformin 2 days prior to surgery    ____ Take 1/2 of usual insulin dose the night before surgery. No insulin the morning          of surgery.   ____ Stop Coumadin/Plavix/aspirin on      ALREADY STOPPED ASPIRIN ____ Stop Anti-inflammatories on    __X__ Stop supplements until after surgery.   GREEN MUSSEL EXTRACT  ____ Bring C-Pap to the hospital.

## 2018-11-26 NOTE — Pre-Procedure Instructions (Signed)
Positive staph aureus results sent to Dr. Roland Rack for review.

## 2018-11-27 LAB — URINE CULTURE: Culture: NO GROWTH

## 2018-11-29 ENCOUNTER — Other Ambulatory Visit
Admission: RE | Admit: 2018-11-29 | Discharge: 2018-11-29 | Disposition: A | Discharge status: Home / Self Care | Payer: Medicare HMO | Source: Ambulatory Visit | Attending: Surgery | Admitting: Surgery

## 2018-11-29 ENCOUNTER — Other Ambulatory Visit: Payer: Self-pay

## 2018-11-29 DIAGNOSIS — Z01818 Encounter for other preprocedural examination: Secondary | ICD-10-CM | POA: Diagnosis not present

## 2018-11-30 LAB — NOVEL CORONAVIRUS, NAA (HOSP ORDER, SEND-OUT TO REF LAB; TAT 18-24 HRS): SARS-CoV-2, NAA: NOT DETECTED

## 2018-12-02 MED ORDER — VANCOMYCIN HCL IN DEXTROSE 1-5 GM/200ML-% IV SOLN: 1000.0000 mg | Freq: Once | INTRAVENOUS | Status: DC

## 2018-12-03 ENCOUNTER — Encounter
Admission: RE | Disposition: A | Discharge status: Home / Self Care | Payer: Self-pay | Source: Home / Self Care | Attending: Surgery

## 2018-12-03 ENCOUNTER — Other Ambulatory Visit: Payer: Self-pay

## 2018-12-03 ENCOUNTER — Inpatient Hospital Stay
Admission: RE | Admit: 2018-12-03 | Discharge: 2018-12-04 | DRG: 483 | Disposition: A | Discharge status: Home / Self Care | Payer: Medicare HMO | Attending: Surgery | Admitting: Surgery

## 2018-12-03 ENCOUNTER — Inpatient Hospital Stay: Payer: Medicare HMO

## 2018-12-03 ENCOUNTER — Inpatient Hospital Stay: Payer: Medicare HMO | Admitting: Anesthesiology

## 2018-12-03 ENCOUNTER — Encounter: Payer: Self-pay | Admitting: Emergency Medicine

## 2018-12-03 DIAGNOSIS — G8918 Other acute postprocedural pain: Secondary | ICD-10-CM | POA: Diagnosis not present

## 2018-12-03 DIAGNOSIS — Z79899 Other long term (current) drug therapy: Secondary | ICD-10-CM | POA: Diagnosis not present

## 2018-12-03 DIAGNOSIS — Z7984 Long term (current) use of oral hypoglycemic drugs: Secondary | ICD-10-CM

## 2018-12-03 DIAGNOSIS — Z7982 Long term (current) use of aspirin: Secondary | ICD-10-CM | POA: Diagnosis not present

## 2018-12-03 DIAGNOSIS — Z96652 Presence of left artificial knee joint: Secondary | ICD-10-CM | POA: Diagnosis not present

## 2018-12-03 DIAGNOSIS — M25512 Pain in left shoulder: Secondary | ICD-10-CM | POA: Diagnosis not present

## 2018-12-03 DIAGNOSIS — E119 Type 2 diabetes mellitus without complications: Secondary | ICD-10-CM | POA: Diagnosis present

## 2018-12-03 DIAGNOSIS — Z96612 Presence of left artificial shoulder joint: Secondary | ICD-10-CM

## 2018-12-03 DIAGNOSIS — M75122 Complete rotator cuff tear or rupture of left shoulder, not specified as traumatic: Secondary | ICD-10-CM | POA: Diagnosis not present

## 2018-12-03 DIAGNOSIS — Z471 Aftercare following joint replacement surgery: Secondary | ICD-10-CM | POA: Diagnosis not present

## 2018-12-03 DIAGNOSIS — M7582 Other shoulder lesions, left shoulder: Secondary | ICD-10-CM | POA: Diagnosis not present

## 2018-12-03 HISTORY — PX: REVERSE SHOULDER ARTHROPLASTY: SHX5054

## 2018-12-03 LAB — CBC
HCT: 39.5 % (ref 39.0–52.0)
Hemoglobin: 13.5 g/dL (ref 13.0–17.0)
MCH: 31.3 pg (ref 26.0–34.0)
MCHC: 34.2 g/dL (ref 30.0–36.0)
MCV: 91.4 fL (ref 80.0–100.0)
Platelets: 282 10*3/uL (ref 150–400)
RBC: 4.32 MIL/uL (ref 4.22–5.81)
RDW: 12.9 % (ref 11.5–15.5)
WBC: 14.1 10*3/uL — ABNORMAL HIGH (ref 4.0–10.5)
nRBC: 0 % (ref 0.0–0.2)

## 2018-12-03 LAB — GLUCOSE, CAPILLARY
Glucose-Capillary: 177 mg/dL — ABNORMAL HIGH (ref 70–99)
Glucose-Capillary: 171 mg/dL — ABNORMAL HIGH (ref 70–99)
Glucose-Capillary: 255 mg/dL — ABNORMAL HIGH (ref 70–99)
Glucose-Capillary: 299 mg/dL — ABNORMAL HIGH (ref 70–99)

## 2018-12-03 LAB — CREATININE, SERUM
Creatinine, Ser: 0.86 mg/dL (ref 0.61–1.24)
GFR calc Af Amer: >60 mL/min (ref >60)
GFR calc non Af Amer: >60 mL/min (ref >60)

## 2018-12-03 LAB — ABO/RH: ABO/RH(D): A POS

## 2018-12-03 SURGERY — ARTHROPLASTY, SHOULDER, TOTAL, REVERSE
Anesthesia: Regional | Laterality: Left

## 2018-12-03 MED ORDER — LIDOCAINE HCL (PF) 1 % IJ SOLN
INTRAMUSCULAR | Status: AC
  Filled 2018-12-03: qty 5

## 2018-12-03 MED ORDER — PROPOFOL 10 MG/ML IV BOLUS
INTRAVENOUS | Status: AC
  Filled 2018-12-03: qty 40

## 2018-12-03 MED ORDER — ACETAMINOPHEN 500 MG PO TABS
1000.0000 mg | ORAL_TABLET | Freq: Four times a day (QID) | ORAL | Status: DC
  Administered 2018-12-03 – 2018-12-04 (×3): 1000 mg via ORAL
  Filled 2018-12-03 (×3): qty 2

## 2018-12-03 MED ORDER — KETOROLAC TROMETHAMINE 15 MG/ML IJ SOLN
7.5000 mg | Freq: Four times a day (QID) | INTRAMUSCULAR | Status: DC
  Administered 2018-12-03 – 2018-12-04 (×3): 7.5 mg via INTRAVENOUS
  Filled 2018-12-03 (×3): qty 1

## 2018-12-03 MED ORDER — ASPIRIN EC 81 MG PO TBEC
81.0000 mg | DELAYED_RELEASE_TABLET | Freq: Every day | ORAL | Status: DC
  Administered 2018-12-04: 81 mg via ORAL
  Filled 2018-12-03: qty 1

## 2018-12-03 MED ORDER — BUPIVACAINE-EPINEPHRINE (PF) 0.5% -1:200000 IJ SOLN
INTRAMUSCULAR | Status: AC
  Filled 2018-12-03: qty 30

## 2018-12-03 MED ORDER — ONDANSETRON HCL 4 MG/2ML IJ SOLN
INTRAMUSCULAR | Status: AC
  Filled 2018-12-03: qty 2

## 2018-12-03 MED ORDER — OXYCODONE HCL 5 MG PO TABS: 5.0000 mg | ORAL_TABLET | ORAL | Status: DC | PRN

## 2018-12-03 MED ORDER — DIPHENHYDRAMINE HCL 12.5 MG/5ML PO ELIX: 12.5000 mg | ORAL_SOLUTION | ORAL | Status: DC | PRN

## 2018-12-03 MED ORDER — TRANEXAMIC ACID 1000 MG/10ML IV SOLN
INTRAVENOUS | Status: DC | PRN
  Administered 2018-12-03: 1000 mg via INTRAVENOUS

## 2018-12-03 MED ORDER — FENTANYL CITRATE (PF) 100 MCG/2ML IJ SOLN
INTRAMUSCULAR | Status: AC
  Filled 2018-12-03: qty 2

## 2018-12-03 MED ORDER — BUPIVACAINE LIPOSOME 1.3 % IJ SUSP
INTRAMUSCULAR | Status: AC
  Filled 2018-12-03: qty 20

## 2018-12-03 MED ORDER — HYDROMORPHONE HCL 1 MG/ML IJ SOLN: 0.2500 mg | INTRAMUSCULAR | Status: DC | PRN

## 2018-12-03 MED ORDER — TRANEXAMIC ACID 1000 MG/10ML IV SOLN
INTRAVENOUS | Status: AC
  Filled 2018-12-03: qty 10

## 2018-12-03 MED ORDER — BUPIVACAINE-EPINEPHRINE (PF) 0.5% -1:200000 IJ SOLN
INTRAMUSCULAR | Status: DC | PRN
  Administered 2018-12-03: 30 mL

## 2018-12-03 MED ORDER — FENTANYL CITRATE (PF) 100 MCG/2ML IJ SOLN
INTRAMUSCULAR | Status: AC
  Administered 2018-12-03: 50 ug via INTRAVENOUS
  Filled 2018-12-03: qty 2

## 2018-12-03 MED ORDER — ONDANSETRON HCL 4 MG/2ML IJ SOLN: 4.0000 mg | Freq: Four times a day (QID) | INTRAMUSCULAR | Status: DC | PRN

## 2018-12-03 MED ORDER — PROPOFOL 10 MG/ML IV BOLUS
INTRAVENOUS | Status: DC | PRN
  Administered 2018-12-03: 140 mg via INTRAVENOUS

## 2018-12-03 MED ORDER — ENOXAPARIN SODIUM 40 MG/0.4ML ~~LOC~~ SOLN
40.0000 mg | SUBCUTANEOUS | Status: DC
  Administered 2018-12-04: 40 mg via SUBCUTANEOUS
  Filled 2018-12-03: qty 0.4

## 2018-12-03 MED ORDER — METFORMIN HCL ER 500 MG PO TB24
1000.0000 mg | ORAL_TABLET | Freq: Two times a day (BID) | ORAL | Status: DC
  Administered 2018-12-03 – 2018-12-04 (×2): 1000 mg via ORAL
  Filled 2018-12-03 (×3): qty 2

## 2018-12-03 MED ORDER — EZETIMIBE 10 MG PO TABS
10.0000 mg | ORAL_TABLET | Freq: Every day | ORAL | Status: DC
  Administered 2018-12-03 – 2018-12-04 (×2): 10 mg via ORAL
  Filled 2018-12-03 (×2): qty 1

## 2018-12-03 MED ORDER — DOCUSATE SODIUM 100 MG PO CAPS
100.0000 mg | ORAL_CAPSULE | Freq: Two times a day (BID) | ORAL | Status: DC
  Administered 2018-12-03 – 2018-12-04 (×2): 100 mg via ORAL
  Filled 2018-12-03 (×2): qty 1

## 2018-12-03 MED ORDER — BUPIVACAINE HCL (PF) 0.5 % IJ SOLN
INTRAMUSCULAR | Status: DC | PRN
  Administered 2018-12-03 (×2): 10 mL

## 2018-12-03 MED ORDER — FAMOTIDINE 20 MG PO TABS
ORAL_TABLET | ORAL | Status: AC
  Administered 2018-12-03: 20 mg via ORAL
  Filled 2018-12-03: qty 1

## 2018-12-03 MED ORDER — FENTANYL CITRATE (PF) 100 MCG/2ML IJ SOLN
50.0000 ug | Freq: Once | INTRAMUSCULAR | Status: AC
  Administered 2018-12-03: 50 ug via INTRAVENOUS

## 2018-12-03 MED ORDER — ONDANSETRON HCL 4 MG PO TABS: 4.0000 mg | ORAL_TABLET | Freq: Four times a day (QID) | ORAL | Status: DC | PRN

## 2018-12-03 MED ORDER — DEXAMETHASONE SODIUM PHOSPHATE 10 MG/ML IJ SOLN
INTRAMUSCULAR | Status: DC | PRN
  Administered 2018-12-03: 5 mg via INTRAVENOUS

## 2018-12-03 MED ORDER — LIDOCAINE HCL (CARDIAC) PF 100 MG/5ML IV SOSY
PREFILLED_SYRINGE | INTRAVENOUS | Status: DC | PRN
  Administered 2018-12-03: 60 mg via INTRAVENOUS

## 2018-12-03 MED ORDER — KETOROLAC TROMETHAMINE 15 MG/ML IJ SOLN
INTRAMUSCULAR | Status: AC
  Administered 2018-12-03: 15 mg via INTRAVENOUS
  Filled 2018-12-03: qty 1

## 2018-12-03 MED ORDER — METOCLOPRAMIDE HCL 5 MG/ML IJ SOLN: 5.0000 mg | Freq: Three times a day (TID) | INTRAMUSCULAR | Status: DC | PRN

## 2018-12-03 MED ORDER — ROCURONIUM BROMIDE 100 MG/10ML IV SOLN
INTRAVENOUS | Status: DC | PRN
  Administered 2018-12-03: 50 mg via INTRAVENOUS

## 2018-12-03 MED ORDER — MIDAZOLAM HCL 2 MG/2ML IJ SOLN
INTRAMUSCULAR | Status: AC
  Filled 2018-12-03: qty 2

## 2018-12-03 MED ORDER — INSULIN ASPART 100 UNIT/ML ~~LOC~~ SOLN: 0.0000 [IU] | Freq: Three times a day (TID) | SUBCUTANEOUS | Status: DC

## 2018-12-03 MED ORDER — FLEET ENEMA 7-19 GM/118ML RE ENEM: 1.0000 | ENEMA | Freq: Once | RECTAL | Status: DC | PRN

## 2018-12-03 MED ORDER — CEFAZOLIN SODIUM-DEXTROSE 2-4 GM/100ML-% IV SOLN
2.0000 g | Freq: Four times a day (QID) | INTRAVENOUS | Status: AC
  Administered 2018-12-03 – 2018-12-04 (×3): 2 g via INTRAVENOUS
  Filled 2018-12-03 (×3): qty 100

## 2018-12-03 MED ORDER — SODIUM CHLORIDE 0.9 % IV SOLN
INTRAVENOUS | Status: DC
  Administered 2018-12-03: 09:00:00 via INTRAVENOUS

## 2018-12-03 MED ORDER — SUGAMMADEX SODIUM 200 MG/2ML IV SOLN
INTRAVENOUS | Status: AC
  Filled 2018-12-03: qty 2

## 2018-12-03 MED ORDER — METOCLOPRAMIDE HCL 10 MG PO TABS: 5.0000 mg | ORAL_TABLET | Freq: Three times a day (TID) | ORAL | Status: DC | PRN

## 2018-12-03 MED ORDER — BISACODYL 10 MG RE SUPP: 10.0000 mg | Freq: Every day | RECTAL | Status: DC | PRN

## 2018-12-03 MED ORDER — KETOROLAC TROMETHAMINE 15 MG/ML IJ SOLN
15.0000 mg | Freq: Once | INTRAMUSCULAR | Status: AC
  Administered 2018-12-03: 15 mg via INTRAVENOUS

## 2018-12-03 MED ORDER — SODIUM CHLORIDE FLUSH 0.9 % IV SOLN
INTRAVENOUS | Status: AC
  Filled 2018-12-03: qty 40

## 2018-12-03 MED ORDER — MAGNESIUM HYDROXIDE 400 MG/5ML PO SUSP
30.0000 mL | Freq: Every day | ORAL | Status: DC | PRN
  Filled 2018-12-03: qty 30

## 2018-12-03 MED ORDER — SODIUM CHLORIDE 0.9 % IV SOLN
INTRAVENOUS | Status: DC
  Administered 2018-12-03 – 2018-12-04 (×3): via INTRAVENOUS

## 2018-12-03 MED ORDER — SODIUM CHLORIDE 0.9 % IV SOLN
INTRAVENOUS | Status: DC | PRN
  Administered 2018-12-03: 15 ug/min via INTRAVENOUS

## 2018-12-03 MED ORDER — TRAMADOL HCL 50 MG PO TABS: 50.0000 mg | ORAL_TABLET | Freq: Four times a day (QID) | ORAL | Status: DC | PRN

## 2018-12-03 MED ORDER — BUPIVACAINE HCL (PF) 0.5 % IJ SOLN
INTRAMUSCULAR | Status: AC
  Filled 2018-12-03: qty 10

## 2018-12-03 MED ORDER — PHENYLEPHRINE HCL (PRESSORS) 10 MG/ML IV SOLN
INTRAVENOUS | Status: DC | PRN
  Administered 2018-12-03: 10 ug via INTRAVENOUS

## 2018-12-03 MED ORDER — B COMPLEX-C PO TABS
1.0000 | ORAL_TABLET | Freq: Every day | ORAL | Status: DC
  Administered 2018-12-04: 1 via ORAL
  Filled 2018-12-03 (×2): qty 1

## 2018-12-03 MED ORDER — SUGAMMADEX SODIUM 200 MG/2ML IV SOLN
INTRAVENOUS | Status: DC | PRN
  Administered 2018-12-03: 75 mg via INTRAVENOUS

## 2018-12-03 MED ORDER — CEFAZOLIN SODIUM-DEXTROSE 2-4 GM/100ML-% IV SOLN
INTRAVENOUS | Status: AC
  Filled 2018-12-03: qty 100

## 2018-12-03 MED ORDER — FENTANYL CITRATE (PF) 100 MCG/2ML IJ SOLN
INTRAMUSCULAR | Status: DC | PRN
  Administered 2018-12-03 (×3): 50 ug via INTRAVENOUS

## 2018-12-03 MED ORDER — DEXAMETHASONE SODIUM PHOSPHATE 10 MG/ML IJ SOLN
INTRAMUSCULAR | Status: AC
  Filled 2018-12-03: qty 1

## 2018-12-03 MED ORDER — LIDOCAINE HCL (PF) 1 % IJ SOLN
INTRAMUSCULAR | Status: DC | PRN
  Administered 2018-12-03: 1 mL

## 2018-12-03 MED ORDER — ONDANSETRON HCL 4 MG/2ML IJ SOLN
INTRAMUSCULAR | Status: DC | PRN
  Administered 2018-12-03: 4 mg via INTRAVENOUS

## 2018-12-03 MED ORDER — BUPIVACAINE LIPOSOME 1.3 % IJ SUSP
INTRAMUSCULAR | Status: DC | PRN
  Administered 2018-12-03: 10 mL

## 2018-12-03 MED ORDER — VITAMIN C 500 MG PO TABS
1000.0000 mg | ORAL_TABLET | Freq: Every day | ORAL | Status: DC
  Administered 2018-12-04: 1000 mg via ORAL
  Filled 2018-12-03: qty 2

## 2018-12-03 MED ORDER — LIDOCAINE HCL (PF) 2 % IJ SOLN
INTRAMUSCULAR | Status: AC
  Filled 2018-12-03: qty 10

## 2018-12-03 MED ORDER — CEFAZOLIN SODIUM-DEXTROSE 2-4 GM/100ML-% IV SOLN
2.0000 g | Freq: Once | INTRAVENOUS | Status: AC
  Administered 2018-12-03: 2 g via INTRAVENOUS

## 2018-12-03 MED ORDER — EPHEDRINE SULFATE 50 MG/ML IJ SOLN: INTRAMUSCULAR | Status: DC | PRN

## 2018-12-03 MED ORDER — FAMOTIDINE 20 MG PO TABS
20.0000 mg | ORAL_TABLET | Freq: Once | ORAL | Status: AC
  Administered 2018-12-03: 20 mg via ORAL

## 2018-12-03 MED ORDER — GLIPIZIDE 10 MG PO TABS
20.0000 mg | ORAL_TABLET | Freq: Two times a day (BID) | ORAL | Status: DC
  Administered 2018-12-03 – 2018-12-04 (×2): 20 mg via ORAL
  Filled 2018-12-03 (×3): qty 2

## 2018-12-03 SURGICAL SUPPLY — 65 items
BASEPLATE GLENOSPHERE 25 (Plate) ×2 IMPLANT
BEARING HUMERAL SHLDER 36M STD (Shoulder) ×1 IMPLANT
BIT DRILL TWIST 2.7 (BIT) ×2 IMPLANT
BLADE SAW SAG 25X90X1.19 (BLADE) ×2 IMPLANT
CANISTER SUCT 1200ML W/VALVE (MISCELLANEOUS) ×2 IMPLANT
CANISTER SUCT 3000ML PPV (MISCELLANEOUS) ×4 IMPLANT
CHLORAPREP W/TINT 26 (MISCELLANEOUS) ×2 IMPLANT
COOLER POLAR GLACIER W/PUMP (MISCELLANEOUS) ×2 IMPLANT
COVER WAND RF STERILE (DRAPES) ×2 IMPLANT
CRADLE LAMINECT ARM (MISCELLANEOUS) ×2 IMPLANT
DRAPE IMP U-DRAPE 54X76 (DRAPES) ×4 IMPLANT
DRAPE INCISE IOBAN 66X45 STRL (DRAPES) ×4 IMPLANT
DRAPE SHEET LG 3/4 BI-LAMINATE (DRAPES) ×4 IMPLANT
DRAPE TABLE BACK 80X90 (DRAPES) ×2 IMPLANT
DRSG OPSITE POSTOP 4X10 (GAUZE/BANDAGES/DRESSINGS) ×2 IMPLANT
DRSG OPSITE POSTOP 4X8 (GAUZE/BANDAGES/DRESSINGS) ×2 IMPLANT
ELECT BLADE 6.5 EXT (BLADE) IMPLANT
ELECT CAUTERY BLADE 6.4 (BLADE) ×2 IMPLANT
GLENOID SPHERE STD STRL 36MM (Orthopedic Implant) ×2 IMPLANT
GLOVE BIO SURGEON STRL SZ7.5 (GLOVE) ×8 IMPLANT
GLOVE BIO SURGEON STRL SZ8 (GLOVE) ×8 IMPLANT
GLOVE BIOGEL PI IND STRL 8 (GLOVE) ×1 IMPLANT
GLOVE BIOGEL PI INDICATOR 8 (GLOVE) ×1
GLOVE INDICATOR 8.0 STRL GRN (GLOVE) ×2 IMPLANT
GOWN STRL REUS W/ TWL LRG LVL3 (GOWN DISPOSABLE) ×2 IMPLANT
GOWN STRL REUS W/ TWL XL LVL3 (GOWN DISPOSABLE) ×1 IMPLANT
GOWN STRL REUS W/TWL LRG LVL3 (GOWN DISPOSABLE) ×2
GOWN STRL REUS W/TWL XL LVL3 (GOWN DISPOSABLE) ×1
HOOD PEEL AWAY FLYTE STAYCOOL (MISCELLANEOUS) ×6 IMPLANT
KIT STABILIZATION SHOULDER (MISCELLANEOUS) ×2 IMPLANT
KIT TURNOVER KIT A (KITS) ×2 IMPLANT
MASK FACE SPIDER DISP (MASK) ×2 IMPLANT
MAT ABSORB  FLUID 56X50 GRAY (MISCELLANEOUS) ×1
MAT ABSORB FLUID 56X50 GRAY (MISCELLANEOUS) ×1 IMPLANT
NDL SAFETY ECLIPSE 18X1.5 (NEEDLE) ×1 IMPLANT
NEEDLE HYPO 18GX1.5 SHARP (NEEDLE) ×1
NEEDLE HYPO 22GX1.5 SAFETY (NEEDLE) ×2 IMPLANT
NEEDLE SPNL 20GX3.5 QUINCKE YW (NEEDLE) ×2 IMPLANT
NS IRRIG 500ML POUR BTL (IV SOLUTION) ×2 IMPLANT
PACK ARTHROSCOPY SHOULDER (MISCELLANEOUS) ×2 IMPLANT
PAD WRAPON POLAR SHDR UNIV (MISCELLANEOUS) ×1 IMPLANT
PIN THREADED REVERSE (PIN) ×2 IMPLANT
PULSAVAC PLUS IRRIG FAN TIP (DISPOSABLE) ×2
SCREW BONE LOCKING 4.75X30X3.5 (Screw) ×4 IMPLANT
SCREW BONE STRL 6.5MMX45MM (Screw) ×2 IMPLANT
SCREW NON-LOCK 4.75MMX15MM (Screw) ×2 IMPLANT
SCREW NON-LOCK 4.75X30X3.5 (Screw) ×2 IMPLANT
SHOULDER HUMERAL BEAR 36M STD (Shoulder) ×2 IMPLANT
SLING ULTRA II M (MISCELLANEOUS) ×2 IMPLANT
SOL .9 NS 3000ML IRR  AL (IV SOLUTION) ×1
SOL .9 NS 3000ML IRR UROMATIC (IV SOLUTION) ×1 IMPLANT
SPONGE LAP 18X18 RF (DISPOSABLE) ×2 IMPLANT
STAPLER SKIN PROX 35W (STAPLE) ×2 IMPLANT
STEM HUMERAL STRL 11MMX55MM (Stem) ×2 IMPLANT
SUT ETHIBOND 0 MO6 C/R (SUTURE) ×2 IMPLANT
SUT FIBERWIRE #2 38 BLUE 1/2 (SUTURE) ×8
SUT VIC AB 0 CT1 36 (SUTURE) ×2 IMPLANT
SUT VIC AB 2-0 CT1 27 (SUTURE) ×2
SUT VIC AB 2-0 CT1 TAPERPNT 27 (SUTURE) ×2 IMPLANT
SUTURE FIBERWR #2 38 BLUE 1/2 (SUTURE) ×4 IMPLANT
SYR 10ML LL (SYRINGE) ×2 IMPLANT
SYR 30ML LL (SYRINGE) IMPLANT
TIP FAN IRRIG PULSAVAC PLUS (DISPOSABLE) ×1 IMPLANT
TRAY HUM REV SHOULDER STD +6 (Shoulder) ×2 IMPLANT
WRAPON POLAR PAD SHDR UNIV (MISCELLANEOUS) ×2

## 2018-12-03 NOTE — Anesthesia Post-op Follow-up Note (Signed)
Anesthesia QCDR form completed.        

## 2018-12-03 NOTE — Anesthesia Procedure Notes (Signed)
Anesthesia Regional Block: Interscalene brachial plexus block   Pre-Anesthetic Checklist: ,, timeout performed, Correct Patient, Correct Site, Correct Laterality, Correct Procedure, Correct Position, site marked, Risks and benefits discussed,  Surgical consent,  Pre-op evaluation,  At surgeon's request and post-op pain management  Laterality: Left     Needles:   Needle Type: Stimiplex     Needle Length: 9cm  Needle Gauge: 21     Additional Needles:   Procedures:,,,, ultrasound used (permanent image in chart),,,,  Narrative:  Start time: 12/03/2018 9:48 AM End time: 12/03/2018 10:00 AM  Performed by: Personally  Anesthesiologist: Durenda Hurt, MD  Additional Notes: Negative aspiration.  Negative paresthesia on injection.  Dose given in divided aliquots under ultrasound guidance.

## 2018-12-03 NOTE — Transfer of Care (Signed)
Immediate Anesthesia Transfer of Care Note  Patient: Martin Watts  Procedure(s) Performed: REVERSE SHOULDER ARTHROPLASTY Biomet Comprehensive System (Left )  Patient Location: PACU  Anesthesia Type:General  Level of Consciousness: awake and drowsy  Airway & Oxygen Therapy: Patient Spontanous Breathing and Patient connected to face mask oxygen  Post-op Assessment: Report given to RN and Post -op Vital signs reviewed and stable  Post vital signs: Reviewed and stable  Last Vitals:  Vitals Value Taken Time  BP 118/64 12/03/2018 12:45 PM  Temp 36.3 C 12/03/2018 12:45 PM  Pulse 73 12/03/2018 12:48 PM  Resp 20 12/03/2018 12:48 PM  SpO2 100 % 12/03/2018 12:48 PM  Vitals shown include unvalidated device data.  Last Pain:  Vitals:   12/03/18 0830  TempSrc: Temporal  PainSc: 3          Complications: No apparent anesthesia complications

## 2018-12-03 NOTE — Op Note (Signed)
12/03/2018  12:21 PM  Patient:   Martin Watts  Pre-Op Diagnosis:   Massive irreparable rotator cuff tear, left shoulder.  Post-Op Diagnosis:   Same  Procedure:   Reverse left total shoulder arthroplasty.  Surgeon:   Pascal Lux, MD  Assistant:   Cameron Proud, PA-C  Anesthesia:   General endotracheal with an interscalene block using Exparel placed preoperatively by the anesthesiologist.  Findings:   As above.  Complications:   None  EBL:    100 cc  Fluids:   1000 cc crystalloid  UOP:   None  TT:   None  Drains:   None  Closure:   Staples  Implants:   All press-fit Biomet Comprehensive system with a #11 micro-humeral stem, a +6 mm laterally offset 40 mm humeral tray with a standard E-polyethylene insert, and a mini-base plate with a 36 mm glenosphere.  Brief Clinical Note:   The patient is a 73 year old male with a long history of progressively worsening pain and weakness of his left shoulder. His symptoms have persisted despite medications, activity modification, etc. His history and examination were consistent with a massive rotator cuff tear which was confirmed by MRI scan. The MRI scan showed moderate atrophy of the supraspinatus and infraspinatus muscles, suggesting that the tear was irreparable. The patient presents at this time for a reverse left total shoulder arthroplasty.  Procedure:   The patient underwent placement of an interscalene block using Exparel by the anesthesiologist in the preoperative holding area before being brought into the operating room and lain in the supine position. The patient then underwent general endotracheal intubation and anesthesia before the patient was repositioned in the beach chair position using the beach chair positioner. The left shoulder and upper extremity were prepped with ChloraPrep solution before being draped sterilely. Preoperative antibiotics were administered. A standard anterior approach to the shoulder was made through  an approximately 4-5 inch incision. The incision was carried down through the subcutaneous tissues to expose the deltopectoral fascia. The interval between the deltoid and pectoralis muscles was identified and this plane developed, retracting the cephalic vein laterally with the deltoid muscle. The conjoined tendon was identified. Its lateral margin was dissected and the Kolbel self-retraining retractor inserted. The "three sisters" were identified and cauterized. Bursal tissues were removed to improve visualization. The subscapularis tendon was released from its attachment to the lesser tuberosity 1 cm proximal to its insertion and several tagging sutures placed. The inferior capsule was released with care after identifying and protecting the axillary nerve. The proximal humeral cut was made at approximately 25 of retroversion using the extra-medullary guide.   Attention was redirected to the glenoid. The labrum was debrided circumferentially before the center of the glenoid was marked with electrocautery. The guidewire was drilled into the glenoid neck using the appropriate guide. After verifying its position, it was overreamed with the mini-baseplate reamer to create a flat surface. The permanent mini-baseplate was impacted into place. It was stabilized with a 45 x 6.5 mm central screw and four peripheral screws. Locking screws were placed superiorly and inferiorly while nonlocking screws were placed anteriorly and posteriorly. The permanent 36 mm glenosphere was then impacted into place and its Morse taper locking mechanism verified using manual distraction.  Attention was directed to the humeral side. The humeral canal was reamed sequentially beginning with the end-cutting reamer then progressing from a 4 mm reamer up to a 12 mm reamer. This provided excellent circumferential chatter. The canal was broached beginning with a #  9 broach and progressing to a #12 broach. This was left in place and a trial  reduction performed using the standard and +6 mm laterally offset trial humeral platforms. Even with the laterally offset trial platform, the shoulder could not be reduced. Therefore, the stem was removed and an additional 3 to 4 mm of bone removed from the proximal humerus. A repeat trial reduction was performed using the #11 broach and the +6 mm laterally offset trial humeral platform with the +0 mm insert.  With these devices, the arm demonstrated excellent range of motion as the hand could be brought across the chest to the opposite shoulder and brought to the top of the patient's head and to the patient's ear. The shoulder appeared stable throughout this range of motion. The joint was dislocated and the trial components removed. The permanent #11 micro-stem was impacted into place with care taken to maintain the appropriate version. The permanent +6 mm laterally offset 40 mm humeral platform with the standard insert was put together on the back table and impacted into place. Again, the [facility] taper locking mechanism was verified using manual distraction. The shoulder was relocated using two finger pressure and again placed through a range of motion with the findings as described above.  The wound was copiously irrigated with bacitracin saline solution using the jet lavage system before a total of 30 cc of 0.5% Sensorcaine with epinephrine was injected into the pericapsular and peri-incisional tissues to help with postoperative analgesia. The subscapularis tendon was reapproximated using #2 FiberWire interrupted sutures. The deltopectoral interval was closed using #0 Vicryl interrupted sutures before the subcutaneous tissues were closed using 2-0 Vicryl interrupted sutures. The skin was closed using staples. Prior to closing the skin, 1 g of transexemic acid in 10 cc of normal saline was injected intra-articularly to help with postoperative bleeding. A sterile occlusive dressing was applied to the wound before  the arm was placed into a shoulder immobilizer with an abduction pillow. A Polar Care system also was applied to the shoulder. The patient was then transferred back to a hospital bed before being awakened, extubated, and returned to the recovery room in satisfactory condition after tolerating the procedure well.

## 2018-12-03 NOTE — Progress Notes (Signed)
Spoke with patients wife with his permission. Updated on times and process.

## 2018-12-03 NOTE — H&P (Signed)
Paper H&P to be scanned into permanent record. H&P reviewed and patient re-examined. No changes. 

## 2018-12-03 NOTE — Evaluation (Signed)
Physical Therapy Evaluation Patient Details Name: Martin Watts MRN: [id number] DOB: [birth year] Today's Date: 12/03/2018   History of Present Illness  Pt admitted for L reverse shoulder sx on 12/03/18 secondary to massive irrepearable rotator cuff tear.   Clinical Impression  Pt is a pleasant 73 year old male who was admitted for L reverse shoulder sx. Pt performs bed mobility/transfers with mod I and ambulation with supervision. Sats WNL on RA with exertion. No pain reported this date. Pt demonstrates deficits with strength/mobility. Will plan for hallway ambulation and stair training next date. Would benefit from skilled PT to address above deficits and promote optimal return to PLOF. Recommending OP PT for follow up.      Follow Up Recommendations Outpatient PT    Equipment Recommendations  None recommended by PT    Recommendations for Other Services       Precautions / Restrictions Precautions Precautions: Fall;Shoulder Shoulder Interventions: Shoulder abduction pillow Precaution Booklet Issued: No Restrictions Weight Bearing Restrictions: Yes LUE Weight Bearing: Non weight bearing      Mobility  Bed Mobility Overal bed mobility: Modified Independent             General bed mobility comments: safe technique, use of railing. Once seated, able to sit with upright posture  Transfers Overall transfer level: Modified independent Equipment used: None             General transfer comment: upright posture. Pushed from bed on R hand.  Ambulation/Gait Ambulation/Gait assistance: Supervision Gait Distance (Feet): 5 Feet Assistive device: None Gait Pattern/deviations: Step-to pattern     General Gait Details: Pt with dinner tray arriving, wishing to eat in recliner. Safe technique with step to gait pattern. Supervision for line/lead management  Stairs            Wheelchair Mobility    Modified Rankin (Stroke Patients Only)       Balance Overall  balance assessment: Modified Independent                                           Pertinent Vitals/Pain Pain Assessment: No/denies pain    Home Living Family/patient expects to be discharged to:: Private residence Living Arrangements: Spouse/significant other Available Help at Discharge: Family;Available 24 hours/day Type of Home: House Home Access: Stairs to enter Entrance Stairs-Rails: None Entrance Stairs-Number of Steps: 1 Home Layout: One level Home Equipment: None      Prior Function Level of Independence: Independent         Comments: no falls in last 6 months     Hand Dominance   Dominant Hand: Right    Extremity/Trunk Assessment   Upper Extremity Assessment Upper Extremity Assessment: Overall WFL for tasks assessed(L UE not tested due to SX)    Lower Extremity Assessment Lower Extremity Assessment: Overall WFL for tasks assessed       Communication   Communication: No difficulties  Cognition Arousal/Alertness: Awake/alert Behavior During Therapy: WFL for tasks assessed/performed Overall Cognitive Status: Within Functional Limits for tasks assessed                                        General Comments      Exercises     Assessment/Plan    PT Assessment Patient needs continued PT services  PT  Problem List Decreased strength;Decreased range of motion;Decreased mobility;Decreased knowledge of precautions       PT Treatment Interventions Gait training;Stair training;Therapeutic exercise    PT Goals (Current goals can be found in the Care Plan section)  Acute Rehab PT Goals Patient Stated Goal: to go home PT Goal Formulation: With patient Time For Goal Achievement: 12/17/18 Potential to Achieve Goals: Good    Frequency BID   Barriers to discharge        Co-evaluation               AM-PAC PT "6 Clicks" Mobility  Outcome Measure Help needed turning from your back to your side while in a flat  bed without using bedrails?: None Help needed moving from lying on your back to sitting on the side of a flat bed without using bedrails?: None Help needed moving to and from a bed to a chair (including a wheelchair)?: None Help needed standing up from a chair using your arms (e.g., wheelchair or bedside chair)?: None Help needed to walk in hospital room?: A Little Help needed climbing 3-5 steps with a railing? : A Little 6 Click Score: 22    End of Session Equipment Utilized During Treatment: (L shoulder abduction brace) Activity Tolerance: Patient tolerated treatment well Patient left: in chair;with chair alarm set Nurse Communication: Mobility status PT Visit Diagnosis: Muscle weakness (generalized) (M62.81);Difficulty in walking, not elsewhere classified (R26.2)    Time: 1445-1500 PT Time Calculation (min) (ACUTE ONLY): 15 min   Charges:   PT Evaluation $PT Eval Low Complexity: [place], PT, DPT [phone]   Jesselee Poth 12/03/2018, 4:43 PM

## 2018-12-03 NOTE — Anesthesia Preprocedure Evaluation (Addendum)
Anesthesia Evaluation  Patient identified by MRN, date of birth, ID band Patient awake    Reviewed: Allergy & Precautions, H&P , NPO status , Patient's Chart, lab work & pertinent test results  Airway Mallampati: II  TM Distance: >3 FB     Dental  (+) Chipped   Pulmonary neg pulmonary ROS, neg shortness of breath, neg COPD,           Cardiovascular (-) hypertension(-) angina(-) Past MI negative cardio ROS  (-) dysrhythmias      Neuro/Psych negative neurological ROS  negative psych ROS   GI/Hepatic negative GI ROS, Neg liver ROS,   Endo/Other  diabetes, Type 2  Renal/GU      Musculoskeletal   Abdominal   Peds  Hematology negative hematology ROS (+)   Anesthesia Other Findings Past Medical History: No date: Arthritis No date: Diabetes mellitus without complication (HCC)  Past Surgical History: No date: BACK SURGERY No date: ctr No date: dental implant No date: KNEE SURGERY; Left No date: shoulder sugery  BMI    Body Mass Index:  25.82 kg/m      Reproductive/Obstetrics negative OB ROS                             Anesthesia Physical Anesthesia Plan  ASA: II  Anesthesia Plan: General ETT and Regional   Post-op Pain Management:    Induction:   PONV Risk Score and Plan: Ondansetron, Dexamethasone, Midazolam and Treatment may vary due to age or medical condition  Airway Management Planned:   Additional Equipment:   Intra-op Plan:   Post-operative Plan:   Informed Consent: I have reviewed the patients History and Physical, chart, labs and discussed the procedure including the risks, benefits and alternatives for the proposed anesthesia with the patient or authorized representative who has indicated his/her understanding and acceptance.     Dental Advisory Given  Plan Discussed with: Anesthesiologist and CRNA  Anesthesia Plan Comments:         Anesthesia Quick  Evaluation

## 2018-12-03 NOTE — Progress Notes (Signed)
Spoke with Dr Roland Rack, patient had vancomycin ordered this morning for surgical antibiotic. Per his written orders he was to have ancef 2g. Per Dr Roland Rack Ancef 2g was correct and fine to give since he was MRSA negative.

## 2018-12-03 NOTE — Anesthesia Procedure Notes (Signed)
Procedure Name: Intubation Date/Time: 12/03/2018 10:29 AM Performed by: Gentry Fitz, CRNA Pre-anesthesia Checklist: Patient identified, Emergency Drugs available, Suction available and Patient being monitored Patient Re-evaluated:Patient Re-evaluated prior to induction Oxygen Delivery Method: Circle system utilized Preoxygenation: Pre-oxygenation with 100% oxygen Induction Type: IV induction and Cricoid Pressure applied Ventilation: Mask ventilation without difficulty Laryngoscope Size: Mac and 4 Grade View: Grade I Tube type: Oral Number of attempts: 1 Airway Equipment and Method: Stylet Placement Confirmation: ETT inserted through vocal cords under direct vision,  positive ETCO2 and breath sounds checked- equal and bilateral Secured at: 21 cm Tube secured with: Tape Dental Injury: Teeth and Oropharynx as per pre-operative assessment

## 2018-12-04 ENCOUNTER — Encounter: Payer: Self-pay | Admitting: Surgery

## 2018-12-04 LAB — BASIC METABOLIC PANEL
Anion gap: 10 (ref 5–15)
BUN: 20 mg/dL (ref 8–23)
CO2: 23 mmol/L (ref 22–32)
Calcium: 8.7 mg/dL — ABNORMAL LOW (ref 8.9–10.3)
Chloride: 104 mmol/L (ref 98–111)
Creatinine, Ser: 0.93 mg/dL (ref 0.61–1.24)
GFR calc Af Amer: >60 mL/min (ref >60)
GFR calc non Af Amer: >60 mL/min (ref >60)
Glucose, Bld: 198 mg/dL — ABNORMAL HIGH (ref 70–99)
Potassium: 4.2 mmol/L (ref 3.5–5.1)
Sodium: 137 mmol/L (ref 135–145)

## 2018-12-04 LAB — CBC WITH DIFFERENTIAL/PLATELET
Abs Immature Granulocytes: 0.05 10*3/uL (ref 0.00–0.07)
Basophils Absolute: 0 10*3/uL (ref 0.0–0.1)
Basophils Relative: 0 %
Eosinophils Absolute: 0 10*3/uL (ref 0.0–0.5)
Eosinophils Relative: 0 %
HCT: 39.8 % (ref 39.0–52.0)
Hemoglobin: 13.2 g/dL (ref 13.0–17.0)
Immature Granulocytes: 0 %
Lymphocytes Relative: 12 %
Lymphs Abs: 1.4 10*3/uL (ref 0.7–4.0)
MCH: 30.3 pg (ref 26.0–34.0)
MCHC: 33.2 g/dL (ref 30.0–36.0)
MCV: 91.5 fL (ref 80.0–100.0)
Monocytes Absolute: 1 10*3/uL (ref 0.1–1.0)
Monocytes Relative: 8 %
Neutro Abs: 9.3 10*3/uL — ABNORMAL HIGH (ref 1.7–7.7)
Neutrophils Relative %: 80 %
Platelets: 270 10*3/uL (ref 150–400)
RBC: 4.35 MIL/uL (ref 4.22–5.81)
RDW: 13 % (ref 11.5–15.5)
WBC: 11.8 10*3/uL — ABNORMAL HIGH (ref 4.0–10.5)
nRBC: 0 % (ref 0.0–0.2)

## 2018-12-04 LAB — GLUCOSE, CAPILLARY: Glucose-Capillary: 150 mg/dL — ABNORMAL HIGH (ref 70–99)

## 2018-12-04 MED ORDER — PROPOFOL 10 MG/ML IV BOLUS
INTRAVENOUS | Status: AC
  Filled 2018-12-04: qty 20

## 2018-12-04 MED ORDER — ASPIRIN EC 325 MG PO TBEC: 325.0000 mg | DELAYED_RELEASE_TABLET | Freq: Every day | ORAL | 0 refills | Status: DC

## 2018-12-04 MED ORDER — PROPOFOL 500 MG/50ML IV EMUL
INTRAVENOUS | Status: AC
  Filled 2018-12-04: qty 50

## 2018-12-04 MED ORDER — LIDOCAINE HCL (PF) 2 % IJ SOLN
INTRAMUSCULAR | Status: AC
  Filled 2018-12-04: qty 10

## 2018-12-04 MED ORDER — OXYCODONE HCL 5 MG PO TABS: 5.0000 mg | ORAL_TABLET | ORAL | 0 refills | Status: DC | PRN

## 2018-12-04 NOTE — Progress Notes (Signed)
Pt. Discharged to home via private vehicle. Discharge instructions and medication regimen reviewed at bedside with patient. Pt. verbalizes understanding of instructions and medication regimen. Prescriptions sent home with pt. Patient assessment unchanged from this morning. IV discontinued per policy.

## 2018-12-04 NOTE — Evaluation (Signed)
Occupational Therapy Evaluation Patient Details Name: Martin Watts MRN: [id number] DOB: Feb 12, 1946 Today's Date: 12/04/2018    History of Present Illness Pt admitted for L reverse shoulder sx on 12/03/18 secondary to massive irrepearable rotator cuff tear.    Clinical Impression   Patient was seen for an OT evaluation this date. Pt lives with his spouse in a 1 story home with 1 step to enter. Prior to surgery, pt was active and independent, working full time. Pt reports spouse is able to provided needed level of assist once pt returns home. Pt has orders for LUE to be immobilized and will be NWBing per MD. Patient presents with impaired strength/ROM and sensation to LUE with block not completely resolved yet, mild impairments in balance, and increased need for assist for ADL. These impairments result in a decreased ability to perform self care tasks requiring min-mod assist for UB/LB dressing and bathing and max assist for application of polar care, compression stockings, and sling/immobilizer. Pt instructed in polar care mgt, compression stockings mgt, sling/immobilizer mgt, ROM exercises for LUE (with instructions for no shoulder exercises until full sensation has returned), LUE precautions, adaptive strategies for bathing/dressing/toileting/grooming, positioning and considerations for sleep, and home/routines modifications to maximize falls prevention, safety, and independence. Handout provided. OT adjusted sling/immobilizer and polar care to improve comfort, optimize positioning, and to maximize skin integrity/safety. Pt verbalized understanding of all education/training provided. Pt will benefit from skilled OT services while hospitalized to address these limitations and improve independence in daily tasks. Do not anticipate need for skilled OT services upon discharge.     Follow Up Recommendations  No OT follow up    Equipment Recommendations  None recommended by OT    Recommendations for  Other Services       Precautions / Restrictions Precautions Precautions: Fall;Shoulder Shoulder Interventions: Shoulder abduction pillow;Shoulder sling/immobilizer;Off for dressing/bathing/exercises;At all times Precaution Booklet Issued: Yes (comment) Restrictions Weight Bearing Restrictions: Yes LUE Weight Bearing: Non weight bearing      Mobility Bed Mobility Overal bed mobility: Modified Independent                Transfers Overall transfer level: Needs assistance Equipment used: None Transfers: Sit to/from Stand Sit to Stand: Supervision         General transfer comment: initial cues to steady himself prior to donning pants over hips and before moving away from the EOB to ensure stability/balance    Balance Overall balance assessment: Modified Independent                                         ADL either performed or assessed with clinical judgement   ADL Overall ADL's : Needs assistance/impaired Eating/Feeding: Sitting;Modified independent Eating/Feeding Details (indicate cue type and reason): pt reported mild difficulty opening packets and containers at breakfast Grooming: Sitting;Modified independent Grooming Details (indicate cue type and reason): instructed in modified technique for grooming L underarm Upper Body Bathing: Sitting;Minimal assistance;Moderate assistance;With caregiver independent assisting   Lower Body Bathing: Sit to/from stand;Minimal assistance;With caregiver independent assisting   Upper Body Dressing : Sitting;Minimal assistance;With caregiver independent assisting   Lower Body Dressing: Sit to/from stand;Minimal assistance;With caregiver independent assisting   Toilet Transfer: Ambulation;Min guard                   Vision Baseline Vision/History: Wears glasses Wears Glasses: At all times Patient Visual Report: No change from  baseline       Perception     Praxis      Pertinent Vitals/Pain Pain  Assessment: No/denies pain     Hand Dominance Right   Extremity/Trunk Assessment Upper Extremity Assessment Upper Extremity Assessment: Overall WFL for tasks assessed;LUE deficits/detail LUE Deficits / Details: impaired sensation still, unable to perform active elbow flex/ext, hand/wrist ROM okay LUE: Unable to fully assess due to immobilization   Lower Extremity Assessment Lower Extremity Assessment: Overall WFL for tasks assessed   Cervical / Trunk Assessment Cervical / Trunk Assessment: Normal   Communication Communication Communication: No difficulties   Cognition Arousal/Alertness: Awake/alert Behavior During Therapy: WFL for tasks assessed/performed Overall Cognitive Status: Within Functional Limits for tasks assessed                                     General Comments  shoulder sling/polar care adjusted to optimize positioning and joint/skin protection    Exercises Other Exercises Other Exercises: Pt instructed in comprehensive shoulder discharge instruction sheet for OT addressing shoulder sling/immobilizer mgt, polar care mgt, compression stocking mgt, positioning of LUE, grooming, bathing, dressing techniques, and LUE ROM ex. Handout provided.   Shoulder Instructions      Home Living Family/patient expects to be discharged to:: Private residence Living Arrangements: Spouse/significant other Available Help at Discharge: Family;Available 24 hours/day Type of Home: House Home Access: Stairs to enter [employer] of Steps: 1 Entrance Stairs-Rails: None Home Layout: One level               Home Equipment: None          Prior Functioning/Environment Level of Independence: Independent        Comments: no falls in last 6 months, working full time, no AD        OT Problem List: Decreased strength;Decreased range of motion;Decreased knowledge of precautions;Decreased knowledge of use of DME or AE;Impaired UE functional  use;Impaired sensation;Impaired balance (sitting and/or standing)      OT Treatment/Interventions: Self-care/ADL training;Balance training;Therapeutic exercise;Therapeutic activities;DME and/or AE instruction;Patient/family education    OT Goals(Current goals can be found in the care plan section) Acute Rehab OT Goals Patient Stated Goal: to go home OT Goal Formulation: With patient Time For Goal Achievement: 12/18/18 Potential to Achieve Goals: Good ADL Goals Pt Will Perform Upper Body Dressing: with caregiver independent in assisting;sitting Pt Will Transfer to Toilet: with supervision;ambulating Additional ADL Goal #1: Pt will independently instruct family in compression stocking mgt including donning/doffing, wear schedule, and positioning. Additional ADL Goal #2: Pt will independently instruct family in polar care mgt including donning/doffing, wear schedule, and positioning. Additional ADL Goal #3: Pt will independently instruct family in shoulder sling/immobilizer mgt including donning/doffing, wear schedule, and positioning.  OT Frequency: Min 1X/week   Barriers to D/C:            Co-evaluation              AM-PAC OT "6 Clicks" Daily Activity     Outcome Measure Help from another person eating meals?: None Help from another person taking care of personal grooming?: A Little Help from another person toileting, which includes using toliet, bedpan, or urinal?: A Little Help from another person bathing (including washing, rinsing, drying)?: A Little Help from another person to put on and taking off regular upper body clothing?: A Little Help from another person to put on and taking off regular lower  body clothing?: A Little 6 Click Score: 19   End of Session    Activity Tolerance: Patient tolerated treatment well Patient left: in chair;with call bell/phone within reach;with chair alarm set  OT Visit Diagnosis: Other abnormalities of gait and mobility (R26.89);Muscle  weakness (generalized) (M62.81)                Time: 0981-1914 OT Time Calculation (min): 47 min Charges:  OT General Charges $OT Visit: 1 Visit OT Evaluation $OT Eval Low Complexity: 1 Low OT Treatments $Self Care/Home Management : 23-37 mins $Therapeutic Activity: 8-22 mins  Jeni Salles, MPH, MS, OTR/L ascom [phone] 12/04/18, 9:35 AM

## 2018-12-04 NOTE — Discharge Instructions (Signed)
Diet: As you were doing prior to hospitalization   Shower:  May shower but keep the wounds dry, use an occlusive plastic wrap, NO SOAKING IN TUB.  If the bandage gets wet, change with a clean dry gauze.  Dressing:  You may change your dressing as needed. Leave current dressing in place for a minimum of 3 days.  Activity:  Increase activity slowly as tolerated, but follow the weight bearing instructions below.  No lifting or driving for 6 weeks.  Weight Bearing:   Non-weightbearing to the left arm this AM.  Blood Clot Prevention: Take 1 325mg  aspirin daily for 14 days.  To prevent constipation: you may use a stool softener such as -  Colace (over the counter) 100 mg by mouth twice a day  Drink plenty of fluids (prune juice may be helpful) and high fiber foods Miralax (over the counter) for constipation as needed.    Itching:  If you experience itching with your medications, try taking only a single pain pill, or even half a pain pill at a time.  You may take up to 10 pain pills per day, and you can also use benadryl over the counter for itching or also to help with sleep.   Precautions:  If you experience chest pain or shortness of breath - call 911 immediately for transfer to the hospital emergency department!!  If you develop a fever greater that 101 F, purulent drainage from wound, increased redness or drainage from wound, or calf pain-Call [facility]                                              Follow- Up Appointment:  Please call for an appointment to be seen in 2 weeks at [facility]

## 2018-12-04 NOTE — Progress Notes (Signed)
Subjective: 1 Day Post-Op Procedure(s) (LRB): REVERSE SHOULDER ARTHROPLASTY [employer] (Left) Patient reports pain as mild.   Patient is well, and has had no acute complaints or problems Plan is to go Home after hospital stay. Negative for chest pain and shortness of breath Fever: no Gastrointestinal:Negative for nausea and vomiting  Objective: Vital signs in last 24 hours: Temp:  [97.3 F (36.3 C)-99 F (37.2 C)] 98.1 F (36.7 C) (06/10 0740) Pulse Rate:  [58-93] 64 (06/10 0740) Resp:  [10-20] 18 (06/10 0740) BP: (93-141)/(56-82) 115/56 (06/10 0740) SpO2:  [96 %-100 %] 100 % (06/10 0740) Weight:  [77 kg] 77 kg (06/09 0830)  Intake/Output from previous day:  Intake/Output Summary (Last 24 hours) at 12/04/2018 0747 Last data filed at 12/04/2018 0425 Gross per 24 hour  Intake 1505.84 ml  Output 1080 ml  Net 425.84 ml    Intake/Output this shift: No intake/output data recorded.  Labs: Recent Labs    12/03/18 1352 12/04/18 0500  HGB 13.5 13.2   Recent Labs    12/03/18 1352 12/04/18 0500  WBC 14.1* 11.8*  RBC 4.32 4.35  HCT 39.5 39.8  PLT 282 270   Recent Labs    12/03/18 1352 12/04/18 0500  NA  --  137  K  --  4.2  CL  --  104  CO2  --  23  BUN  --  20  CREATININE 0.86 0.93  GLUCOSE  --  198*  CALCIUM  --  8.7*   No results for input(s): LABPT, INR in the last 72 hours.   EXAM General - Patient is Alert, Appropriate and Oriented Extremity - ABD soft Incision: dressing C/D/I No cellulitis present  Patient with decreased sensation to light touch to the left arm this AM.  Exparel block used yesterday. Dressing/Incision - clean, dry, no drainage Motor Function - intact, moving foot and toes well on exam.   Past Medical History:  Diagnosis Date  . Arthritis   . Diabetes mellitus without complication (HCC)     Assessment/Plan: 1 Day Post-Op Procedure(s) (LRB): REVERSE SHOULDER ARTHROPLASTY [employer] [facility]  (Left) Active Problems:   Status post reverse arthroplasty of shoulder, left  Estimated body mass index is 25.82 kg/m as calculated from the following:   Height as of this encounter: 5\' 8"  (1.727 m).   Weight as of this encounter: 77 kg. Advance diet Up with therapy D/C IV fluids when tolerating po intake.  Labs reviewed this AM. Block still in effect this morning. Up with PT/OT this AM. Patient is urinating without pain, denies any N/V. Plan for discharge home this afternoon  DVT Prophylaxis - Lovenox, Foot Pumps and TED hose Non-weightbearing to the left arm.  Raquel Arabelle Bollig, PA-C [facility] Orthopaedic Surgery 12/04/2018, 7:47 AM

## 2018-12-04 NOTE — Discharge Summary (Signed)
Physician Discharge Summary  Patient ID: Martin Watts MRN: [id number] DOB/AGE: Jan 14, 1946 73 y.o.  Admit date: 12/03/2018 Discharge date: 12/04/2018  Admission Diagnoses:  Nontraumatic complete tear of left rotator cuff; Rotator cuff tendinitis left  Discharge Diagnoses: Patient Active Problem List   Diagnosis Date Noted  . Status post reverse arthroplasty of shoulder, left 12/03/2018    Past Medical History:  Diagnosis Date  . Arthritis   . Diabetes mellitus without complication ([place])    Transfusion: None.   Consultants (if any):   Discharged Condition: Improved  Hospital Course: Martin Watts is an 73 y.o. 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    Surgeries: Procedure(s): REVERSE SHOULDER ARTHROPLASTY [employer] Comprehensive System on 12/03/2018 Patient tolerated the surgery well. Taken to PACU where she was stabilized and then transferred to the orthopedic floor.  Started on Lovenox 40mg  q 24 hrs. Foot pumps applied bilaterally at 80 mm. Heels elevated on bed with rolled towels. No evidence of DVT. Negative Homan. Physical therapy started on day #1 for gait training and transfer. OT started day #1 for ADL and assisted devices.  Patient's IV was removed on POD1.  Implants: All press-fit Biomet Comprehensive system with a #11 micro-humeral stem, a +6 mm laterally offset 40 mm humeral tray with a standard E-polyethylene insert, and a mini-base plate with a 36 mm glenosphere.  He was given perioperative antibiotics:  Anti-infectives (From admission, onward)   Start     Dose/Rate Route Frequency Ordered Stop   12/03/18 1630  ceFAZolin (ANCEF) IVPB 2g/100 mL premix     2 g 200 mL/hr over 30 Minutes Intravenous Every 6 hours 12/03/18 1320 12/04/18 0510   12/03/18 0845  ceFAZolin (ANCEF) IVPB 2g/100 mL premix     2 g 200  mL/hr over 30 Minutes Intravenous  Once 12/03/18 0836 12/03/18 1030   12/03/18 0838  ceFAZolin (ANCEF) 2-4 GM/100ML-% IVPB    Note to Pharmacy:  Lyman Bishop   : cabinet override      12/03/18 0838 12/03/18 1030   12/02/18 2215  vancomycin (VANCOCIN) IVPB 1000 mg/200 mL premix  Status:  Discontinued     1,000 mg 200 mL/hr over 60 Minutes Intravenous  Once 12/02/18 2209 12/03/18 1317    .  He was given sequential compression devices, early ambulation, and Lovenox for DVT prophylaxis.  He benefited maximally from the hospital stay and there were no complications.    Recent vital signs:  Vitals:   12/04/18 0411 12/04/18 0740  BP: 112/75 (!) 115/56  Pulse: (!) 58 64  Resp: 19 18  Temp: 97.7 F (36.5 C) 98.1 F (36.7 C)  SpO2: 99% 100%    Recent laboratory studies:  Lab Results  Component Value Date   HGB 13.2 12/04/2018   HGB 13.5 12/03/2018   Lab Results  Component Value Date   WBC 11.8 (H) 12/04/2018   PLT 270 12/04/2018   No results found for: INR Lab Results  Component Value Date   NA 137 12/04/2018   K 4.2 12/04/2018   CL 104 12/04/2018   CO2 23 12/04/2018   BUN 20 12/04/2018   CREATININE 0.93 12/04/2018   GLUCOSE 198 (H) 12/04/2018    Discharge Medications:   Allergies as of 12/04/2018      Reactions   Statins Other (See Comments)   Myalgia  Medication List    TAKE these medications   aspirin EC 325 MG tablet Take 1 tablet (325 mg total) by mouth daily. What changed:    medication strength  how much to take   b complex vitamins capsule Take 1 capsule by mouth daily.   ezetimibe 10 MG tablet Commonly known as:  ZETIA Take 10 mg by mouth daily.   glipiZIDE 10 MG tablet Commonly known as:  GLUCOTROL Take 20 mg by mouth 2 (two) times daily.   metFORMIN 500 MG 24 hr tablet Commonly known as:  GLUCOPHAGE-XR Take 1,000 mg by mouth 2 (two) times daily with a meal.   OVER THE COUNTER MEDICATION Take 1 tablet by mouth daily. Green  Lipped Mussel   oxyCODONE 5 MG immediate release tablet Commonly known as:  Oxy IR/ROXICODONE Take 1-2 tablets (5-10 mg total) by mouth every 4 (four) hours as needed for moderate pain.   vitamin C 1000 MG tablet Take 1,000 mg by mouth daily.       Diagnostic Studies: Dg Shoulder Left Port  Result Date: 12/03/2018 CLINICAL DATA:  Status post reversed total shoulder arthroplasty. EXAM: LEFT SHOULDER - 1 VIEW COMPARISON:  MRI left shoulder dated August 11, 2018. FINDINGS: Interval left reverse total shoulder arthroplasty. Components are well aligned. No evidence of hardware failure or loosening. Unchanged mild acromioclavicular osteoarthritis. Soft tissues are unremarkable. IMPRESSION: 1. Interval left reverse total shoulder arthroplasty without acute postoperative complication. Electronically Signed   By: Titus Dubin M.D.   On: 12/03/2018 13:10   Disposition: Plan for discharge home this afternoon pending working with PT/OT this AM.  Follow-up Information    Lattie Corns, PA-C Follow up in 14 day(s).   Specialty:  Physician Assistant Why:  Levert Feinstein Removal Contact information: [place] [address] [phone]          Signed: Judson Roch PA-C 12/04/2018, 7:52 AM

## 2018-12-04 NOTE — TOC Transition Note (Signed)
Transition of Care [facility]) - CM/SW Discharge Note   Patient Details  Name: Martin Watts MRN: [id number] Date of Birth: 1946/06/01  Transition of Care [facility]) CM/SW Contact:  Su Hilt, RN Phone Number: 12/04/2018, 8:32 AM   Clinical Narrative:    Met with the patient to discuss DC plan and needs, he lives at home with his wife and she is able to provide transportation,  He will do Outpatient PT He sees Dr Richarda Overlie as pcp and is current with appointments, He uses [facility] and can afford medications He states that he has no needs at this time.   Final next level of care: Home/Self Care Barriers to Discharge: Barriers Resolved   Patient Goals and CMS Choice Patient states their goals for this hospitalization and ongoing recovery are:: go home CMS Medicare.gov Compare Post Acute Care list provided to:: Patient    Discharge Placement                       Discharge Plan and Services                DME Arranged: N/A         HH Arranged: NA          Social Determinants of Health (SDOH) Interventions     Readmission Risk Interventions No flowsheet data found.

## 2018-12-04 NOTE — Anesthesia Postprocedure Evaluation (Signed)
Anesthesia Post Note  Patient: Martin Watts  Procedure(s) Performed: REVERSE SHOULDER ARTHROPLASTY [employer] Comprehensive System (Left )  Patient location during evaluation: PACU Anesthesia Type: Regional and General Level of consciousness: awake and alert Pain management: pain level controlled Vital Signs Assessment: post-procedure vital signs reviewed and stable Respiratory status: spontaneous breathing, nonlabored ventilation and respiratory function stable Cardiovascular status: blood pressure returned to baseline and stable Postop Assessment: no apparent nausea or vomiting Anesthetic complications: no     Last Vitals:  Vitals:   12/04/18 0411 12/04/18 0740  BP: 112/75 (!) 115/56  Pulse: (!) 58 64  Resp: 19 18  Temp: 36.5 C 36.7 C  SpO2: 99% 100%    Last Pain:  Vitals:   12/04/18 0758  TempSrc:   PainSc: 0-No pain                 Durenda Hurt

## 2018-12-04 NOTE — Progress Notes (Signed)
Physical Therapy Treatment Patient Details Name: Martin Watts MRN: [id number] DOB: 05/05/1946 Today's Date: 12/04/2018    History of Present Illness Pt admitted for L reverse shoulder sx on 12/03/18 secondary to massive irrepearable rotator cuff tear.     PT Comments    Pt has met PT goals this session and is motivated to participate in therapy. Still has decreased sensation, reviewed written HEP. Safe ambulation performed with no AD and stair training done. Pt eager to dc home this date.   Follow Up Recommendations  Outpatient PT     Equipment Recommendations  None recommended by PT    Recommendations for Other Services       Precautions / Restrictions Precautions Precautions: Fall;Shoulder Shoulder Interventions: Shoulder abduction pillow;Shoulder sling/immobilizer;Off for dressing/bathing/exercises;At all times Precaution Booklet Issued: Yes (comment) Restrictions Weight Bearing Restrictions: Yes LUE Weight Bearing: Non weight bearing    Mobility  Bed Mobility Overal bed mobility: Modified Independent             General bed mobility comments: not performed, received in recliner  Transfers Overall transfer level: Needs assistance Equipment used: None Transfers: Sit to/from Stand Sit to Stand: Supervision         General transfer comment: safe technique with upright posture.  Ambulation/Gait Ambulation/Gait assistance: Supervision Gait Distance (Feet): 200 Feet Assistive device: None Gait Pattern/deviations: WFL(Within Functional Limits)     General Gait Details: safe technique with reciprocal gait pattern. Good speed and cadence   Stairs Stairs: Yes Stairs assistance: Min guard Stair Management: No rails Number of Stairs: 1 General stair comments: up/down with safe technique. demonstration given prior to performance.   Wheelchair Mobility    Modified Rankin (Stroke Patients Only)       Balance Overall balance assessment: Modified  Independent                                          Cognition Arousal/Alertness: Awake/alert Behavior During Therapy: WFL for tasks assessed/performed Overall Cognitive Status: Within Functional Limits for tasks assessed                                        Exercises Other Exercises Other Exercises: Pt instructed in comprehensive shoulder discharge instruction sheet for OT addressing shoulder sling/immobilizer mgt, polar care mgt, compression stocking mgt, positioning of LUE, grooming, bathing, dressing techniques, and LUE ROM ex. Handout provided. Other Exercises: reviewed written HEP, block still in effect with decreased sensation noted. reviewed frequency and duration    General Comments General comments (skin integrity, edema, etc.): shoulder sling/polar care adjusted to optimize positioning and joint/skin protection      Pertinent Vitals/Pain Pain Assessment: No/denies pain    Home Living Family/patient expects to be discharged to:: Private residence Living Arrangements: Spouse/significant other Available Help at Discharge: Family;Available 24 hours/day Type of Home: House Home Access: Stairs to enter Entrance Stairs-Rails: None Home Layout: One level Home Equipment: None      Prior Function Level of Independence: Independent      Comments: no falls in last 6 months, working full time, no AD   PT Goals (current goals can now be found in the care plan section) Acute Rehab PT Goals Patient Stated Goal: to go home PT Goal Formulation: With patient Time For Goal Achievement: 12/17/18 Potential to Achieve   Goals: Good Progress towards PT goals: Progressing toward goals    Frequency    BID      PT Plan Current plan remains appropriate    Co-evaluation              AM-PAC PT "6 Clicks" Mobility   Outcome Measure  Help needed turning from your back to your side while in a flat bed without using bedrails?: None Help  needed moving from lying on your back to sitting on the side of a flat bed without using bedrails?: None Help needed moving to and from a bed to a chair (including a wheelchair)?: None Help needed standing up from a chair using your arms (e.g., wheelchair or bedside chair)?: None Help needed to walk in hospital room?: None Help needed climbing 3-5 steps with a railing? : None 6 Click Score: 24    End of Session Equipment Utilized During Treatment: Gait belt Activity Tolerance: Patient tolerated treatment well Patient left: in chair;with chair alarm set Nurse Communication: Mobility status PT Visit Diagnosis: Muscle weakness (generalized) (M62.81);Difficulty in walking, not elsewhere classified (R26.2)     Time: 0924-0941 PT Time Calculation (min) (ACUTE ONLY): 17 min  Charges:  $Gait Training: 8-22 mins                     Stephanie Ray, PT, DPT [phone]    Ray,Stephanie 12/04/2018, 10:46 AM   

## 2018-12-05 DIAGNOSIS — Z7984 Long term (current) use of oral hypoglycemic drugs: Secondary | ICD-10-CM | POA: Diagnosis not present

## 2018-12-05 DIAGNOSIS — Z471 Aftercare following joint replacement surgery: Secondary | ICD-10-CM | POA: Diagnosis not present

## 2018-12-05 DIAGNOSIS — M6281 Muscle weakness (generalized): Secondary | ICD-10-CM | POA: Diagnosis not present

## 2018-12-05 DIAGNOSIS — E119 Type 2 diabetes mellitus without complications: Secondary | ICD-10-CM | POA: Diagnosis not present

## 2018-12-05 DIAGNOSIS — Z96612 Presence of left artificial shoulder joint: Secondary | ICD-10-CM | POA: Diagnosis not present

## 2018-12-05 LAB — SURGICAL PATHOLOGY

## 2018-12-06 DIAGNOSIS — E119 Type 2 diabetes mellitus without complications: Secondary | ICD-10-CM | POA: Diagnosis not present

## 2018-12-06 DIAGNOSIS — M6281 Muscle weakness (generalized): Secondary | ICD-10-CM | POA: Diagnosis not present

## 2018-12-06 DIAGNOSIS — Z96612 Presence of left artificial shoulder joint: Secondary | ICD-10-CM | POA: Diagnosis not present

## 2018-12-06 DIAGNOSIS — Z7984 Long term (current) use of oral hypoglycemic drugs: Secondary | ICD-10-CM | POA: Diagnosis not present

## 2018-12-06 DIAGNOSIS — Z471 Aftercare following joint replacement surgery: Secondary | ICD-10-CM | POA: Diagnosis not present

## 2018-12-10 DIAGNOSIS — Z96612 Presence of left artificial shoulder joint: Secondary | ICD-10-CM | POA: Diagnosis not present

## 2018-12-10 DIAGNOSIS — Z471 Aftercare following joint replacement surgery: Secondary | ICD-10-CM | POA: Diagnosis not present

## 2018-12-10 DIAGNOSIS — M6281 Muscle weakness (generalized): Secondary | ICD-10-CM | POA: Diagnosis not present

## 2018-12-10 DIAGNOSIS — E119 Type 2 diabetes mellitus without complications: Secondary | ICD-10-CM | POA: Diagnosis not present

## 2018-12-10 DIAGNOSIS — Z7984 Long term (current) use of oral hypoglycemic drugs: Secondary | ICD-10-CM | POA: Diagnosis not present

## 2018-12-12 DIAGNOSIS — Z7984 Long term (current) use of oral hypoglycemic drugs: Secondary | ICD-10-CM | POA: Diagnosis not present

## 2018-12-12 DIAGNOSIS — M6281 Muscle weakness (generalized): Secondary | ICD-10-CM | POA: Diagnosis not present

## 2018-12-12 DIAGNOSIS — E119 Type 2 diabetes mellitus without complications: Secondary | ICD-10-CM | POA: Diagnosis not present

## 2018-12-12 DIAGNOSIS — Z471 Aftercare following joint replacement surgery: Secondary | ICD-10-CM | POA: Diagnosis not present

## 2018-12-12 DIAGNOSIS — Z96612 Presence of left artificial shoulder joint: Secondary | ICD-10-CM | POA: Diagnosis not present

## 2018-12-17 DIAGNOSIS — Z96612 Presence of left artificial shoulder joint: Secondary | ICD-10-CM | POA: Diagnosis not present

## 2018-12-17 DIAGNOSIS — E119 Type 2 diabetes mellitus without complications: Secondary | ICD-10-CM | POA: Diagnosis not present

## 2018-12-17 DIAGNOSIS — M6281 Muscle weakness (generalized): Secondary | ICD-10-CM | POA: Diagnosis not present

## 2018-12-17 DIAGNOSIS — Z7984 Long term (current) use of oral hypoglycemic drugs: Secondary | ICD-10-CM | POA: Diagnosis not present

## 2018-12-17 DIAGNOSIS — Z471 Aftercare following joint replacement surgery: Secondary | ICD-10-CM | POA: Diagnosis not present

## 2018-12-18 DIAGNOSIS — Z96612 Presence of left artificial shoulder joint: Secondary | ICD-10-CM | POA: Diagnosis not present

## 2018-12-18 DIAGNOSIS — M25612 Stiffness of left shoulder, not elsewhere classified: Secondary | ICD-10-CM | POA: Diagnosis not present

## 2018-12-18 DIAGNOSIS — M25512 Pain in left shoulder: Secondary | ICD-10-CM | POA: Diagnosis not present

## 2018-12-18 DIAGNOSIS — M6281 Muscle weakness (generalized): Secondary | ICD-10-CM | POA: Diagnosis not present

## 2018-12-22 DIAGNOSIS — R69 Illness, unspecified: Secondary | ICD-10-CM | POA: Diagnosis not present

## 2018-12-24 DIAGNOSIS — M25512 Pain in left shoulder: Secondary | ICD-10-CM | POA: Diagnosis not present

## 2018-12-24 DIAGNOSIS — Z96612 Presence of left artificial shoulder joint: Secondary | ICD-10-CM | POA: Diagnosis not present

## 2019-01-01 DIAGNOSIS — M25512 Pain in left shoulder: Secondary | ICD-10-CM | POA: Diagnosis not present

## 2019-01-01 DIAGNOSIS — Z96612 Presence of left artificial shoulder joint: Secondary | ICD-10-CM | POA: Diagnosis not present

## 2019-01-06 DIAGNOSIS — Z96612 Presence of left artificial shoulder joint: Secondary | ICD-10-CM | POA: Diagnosis not present

## 2019-01-06 DIAGNOSIS — M25512 Pain in left shoulder: Secondary | ICD-10-CM | POA: Diagnosis not present

## 2019-01-12 DIAGNOSIS — R300 Dysuria: Secondary | ICD-10-CM | POA: Diagnosis not present

## 2019-01-12 DIAGNOSIS — N390 Urinary tract infection, site not specified: Secondary | ICD-10-CM | POA: Diagnosis not present

## 2019-01-14 ENCOUNTER — Emergency Department: Payer: Medicare HMO

## 2019-01-14 ENCOUNTER — Encounter: Payer: Self-pay | Admitting: Intensive Care

## 2019-01-14 ENCOUNTER — Other Ambulatory Visit: Payer: Self-pay

## 2019-01-14 ENCOUNTER — Observation Stay
Admission: EM | Admit: 2019-01-14 | Discharge: 2019-01-16 | Disposition: A | Discharge status: Home / Self Care | Payer: Medicare HMO | Attending: Internal Medicine | Admitting: Internal Medicine

## 2019-01-14 DIAGNOSIS — I5032 Chronic diastolic (congestive) heart failure: Secondary | ICD-10-CM | POA: Insufficient documentation

## 2019-01-14 DIAGNOSIS — Z96612 Presence of left artificial shoulder joint: Secondary | ICD-10-CM | POA: Insufficient documentation

## 2019-01-14 DIAGNOSIS — Z7984 Long term (current) use of oral hypoglycemic drugs: Secondary | ICD-10-CM | POA: Diagnosis not present

## 2019-01-14 DIAGNOSIS — Z0389 Encounter for observation for other suspected diseases and conditions ruled out: Secondary | ICD-10-CM | POA: Diagnosis not present

## 2019-01-14 DIAGNOSIS — D72829 Elevated white blood cell count, unspecified: Secondary | ICD-10-CM | POA: Diagnosis not present

## 2019-01-14 DIAGNOSIS — Z79899 Other long term (current) drug therapy: Secondary | ICD-10-CM | POA: Diagnosis not present

## 2019-01-14 DIAGNOSIS — N390 Urinary tract infection, site not specified: Secondary | ICD-10-CM | POA: Diagnosis not present

## 2019-01-14 DIAGNOSIS — E119 Type 2 diabetes mellitus without complications: Secondary | ICD-10-CM | POA: Insufficient documentation

## 2019-01-14 DIAGNOSIS — E1122 Type 2 diabetes mellitus with diabetic chronic kidney disease: Secondary | ICD-10-CM | POA: Diagnosis not present

## 2019-01-14 DIAGNOSIS — Z1159 Encounter for screening for other viral diseases: Secondary | ICD-10-CM | POA: Insufficient documentation

## 2019-01-14 DIAGNOSIS — M25512 Pain in left shoulder: Secondary | ICD-10-CM | POA: Diagnosis not present

## 2019-01-14 DIAGNOSIS — A419 Sepsis, unspecified organism: Secondary | ICD-10-CM | POA: Diagnosis not present

## 2019-01-14 DIAGNOSIS — M1990 Unspecified osteoarthritis, unspecified site: Secondary | ICD-10-CM | POA: Insufficient documentation

## 2019-01-14 DIAGNOSIS — Z20828 Contact with and (suspected) exposure to other viral communicable diseases: Secondary | ICD-10-CM | POA: Diagnosis not present

## 2019-01-14 DIAGNOSIS — Z7982 Long term (current) use of aspirin: Secondary | ICD-10-CM | POA: Insufficient documentation

## 2019-01-14 HISTORY — DX: Unspecified diastolic (congestive) heart failure: I50.30

## 2019-01-14 LAB — URINALYSIS, COMPLETE (UACMP) WITH MICROSCOPIC
Bacteria, UA: NONE SEEN
Specific Gravity, Urine: 1.023 (ref 1.005–1.030)
Squamous Epithelial / HPF: NONE SEEN (ref 0–5)

## 2019-01-14 LAB — SARS CORONAVIRUS 2 BY RT PCR (HOSPITAL ORDER, PERFORMED IN ~~LOC~~ HOSPITAL LAB): SARS Coronavirus 2: NEGATIVE

## 2019-01-14 LAB — CBC WITH DIFFERENTIAL/PLATELET
Abs Immature Granulocytes: 0.07 10*3/uL (ref 0.00–0.07)
Basophils Absolute: 0 10*3/uL (ref 0.0–0.1)
Basophils Relative: 0 %
Eosinophils Absolute: 0 10*3/uL (ref 0.0–0.5)
Eosinophils Relative: 0 %
HCT: 39.2 % (ref 39.0–52.0)
Hemoglobin: 13.3 g/dL (ref 13.0–17.0)
Immature Granulocytes: 0 %
Lymphocytes Relative: 9 %
Lymphs Abs: 1.5 10*3/uL (ref 0.7–4.0)
MCH: 30.4 pg (ref 26.0–34.0)
MCHC: 33.9 g/dL (ref 30.0–36.0)
MCV: 89.5 fL (ref 80.0–100.0)
Monocytes Absolute: 1.5 10*3/uL — ABNORMAL HIGH (ref 0.1–1.0)
Monocytes Relative: 9 %
Neutro Abs: 13.2 10*3/uL — ABNORMAL HIGH (ref 1.7–7.7)
Neutrophils Relative %: 82 %
Platelets: 325 10*3/uL (ref 150–400)
RBC: 4.38 MIL/uL (ref 4.22–5.81)
RDW: 13.2 % (ref 11.5–15.5)
WBC: 16.3 10*3/uL — ABNORMAL HIGH (ref 4.0–10.5)
nRBC: 0 % (ref 0.0–0.2)

## 2019-01-14 LAB — COMPREHENSIVE METABOLIC PANEL
ALT: 19 U/L (ref 0–44)
AST: 20 U/L (ref 15–41)
Albumin: 4.1 g/dL (ref 3.5–5.0)
Alkaline Phosphatase: 79 U/L (ref 38–126)
Anion gap: 14 (ref 5–15)
BUN: 11 mg/dL (ref 8–23)
CO2: 24 mmol/L (ref 22–32)
Calcium: 9.6 mg/dL (ref 8.9–10.3)
Chloride: 94 mmol/L — ABNORMAL LOW (ref 98–111)
Creatinine, Ser: 0.79 mg/dL (ref 0.61–1.24)
GFR calc Af Amer: >60 mL/min (ref >60)
GFR calc non Af Amer: >60 mL/min (ref >60)
Glucose, Bld: 298 mg/dL — ABNORMAL HIGH (ref 70–99)
Potassium: 3.9 mmol/L (ref 3.5–5.1)
Sodium: 132 mmol/L — ABNORMAL LOW (ref 135–145)
Total Bilirubin: 0.6 mg/dL (ref 0.3–1.2)
Total Protein: 7.3 g/dL (ref 6.5–8.1)

## 2019-01-14 LAB — LACTIC ACID, PLASMA
Lactic Acid, Venous: 2.6 mmol/L (ref 0.5–1.9)
Lactic Acid, Venous: 1.6 mmol/L (ref 0.5–1.9)

## 2019-01-14 MED ORDER — EZETIMIBE 10 MG PO TABS
10.0000 mg | ORAL_TABLET | Freq: Every day | ORAL | Status: DC
  Administered 2019-01-15 – 2019-01-16 (×2): 10 mg via ORAL
  Filled 2019-01-14 (×2): qty 1

## 2019-01-14 MED ORDER — VITAMIN C 500 MG PO TABS
1000.0000 mg | ORAL_TABLET | Freq: Every day | ORAL | Status: DC
  Administered 2019-01-15 – 2019-01-16 (×2): 1000 mg via ORAL
  Filled 2019-01-14 (×2): qty 2

## 2019-01-14 MED ORDER — ACETAMINOPHEN 650 MG RE SUPP: 650.0000 mg | Freq: Four times a day (QID) | RECTAL | Status: DC | PRN

## 2019-01-14 MED ORDER — ACETAMINOPHEN 325 MG PO TABS
650.0000 mg | ORAL_TABLET | Freq: Once | ORAL | Status: AC | PRN
  Administered 2019-01-14: 650 mg via ORAL
  Filled 2019-01-14: qty 2

## 2019-01-14 MED ORDER — TRAZODONE HCL 50 MG PO TABS: 25.0000 mg | ORAL_TABLET | Freq: Every evening | ORAL | Status: DC | PRN

## 2019-01-14 MED ORDER — ASPIRIN EC 81 MG PO TBEC
81.0000 mg | DELAYED_RELEASE_TABLET | Freq: Every day | ORAL | Status: DC
  Administered 2019-01-15 – 2019-01-16 (×2): 81 mg via ORAL
  Filled 2019-01-14 (×2): qty 1

## 2019-01-14 MED ORDER — MAGNESIUM HYDROXIDE 400 MG/5ML PO SUSP: 30.0000 mL | Freq: Every day | ORAL | Status: DC | PRN

## 2019-01-14 MED ORDER — ENOXAPARIN SODIUM 40 MG/0.4ML ~~LOC~~ SOLN
40.0000 mg | SUBCUTANEOUS | Status: DC
  Administered 2019-01-15 – 2019-01-16 (×2): 40 mg via SUBCUTANEOUS
  Filled 2019-01-14 (×2): qty 0.4

## 2019-01-14 MED ORDER — SODIUM CHLORIDE 0.9 % IV SOLN
1.0000 g | INTRAVENOUS | Status: DC
  Administered 2019-01-14: 1 g via INTRAVENOUS
  Filled 2019-01-14: qty 10

## 2019-01-14 MED ORDER — ACETAMINOPHEN 325 MG PO TABS: 650.0000 mg | ORAL_TABLET | Freq: Four times a day (QID) | ORAL | Status: DC | PRN

## 2019-01-14 MED ORDER — B COMPLEX-C PO TABS
1.0000 | ORAL_TABLET | Freq: Every day | ORAL | Status: DC
  Administered 2019-01-15 – 2019-01-16 (×2): 1 via ORAL
  Filled 2019-01-14 (×2): qty 1

## 2019-01-14 MED ORDER — GLIPIZIDE 10 MG PO TABS
10.0000 mg | ORAL_TABLET | Freq: Two times a day (BID) | ORAL | Status: DC
  Administered 2019-01-15: 10 mg via ORAL
  Filled 2019-01-14 (×2): qty 1

## 2019-01-14 MED ORDER — SODIUM CHLORIDE 0.9 % IV BOLUS
1000.0000 mL | Freq: Once | INTRAVENOUS | Status: AC
  Administered 2019-01-14: 1000 mL via INTRAVENOUS

## 2019-01-14 MED ORDER — ONDANSETRON HCL 4 MG PO TABS: 4.0000 mg | ORAL_TABLET | Freq: Four times a day (QID) | ORAL | Status: DC | PRN

## 2019-01-14 MED ORDER — SODIUM CHLORIDE 0.9 % IV SOLN
INTRAVENOUS | Status: DC
  Administered 2019-01-15: 02:00:00 via INTRAVENOUS

## 2019-01-14 MED ORDER — SODIUM CHLORIDE 0.9 % IV SOLN
1.0000 g | INTRAVENOUS | Status: DC
  Administered 2019-01-15: 1 g via INTRAVENOUS
  Filled 2019-01-14: qty 10
  Filled 2019-01-14: qty 1

## 2019-01-14 MED ORDER — ONDANSETRON HCL 4 MG/2ML IJ SOLN: 4.0000 mg | Freq: Four times a day (QID) | INTRAMUSCULAR | Status: DC | PRN

## 2019-01-14 NOTE — ED Notes (Addendum)
ED TO INPATIENT HANDOFF REPORT  ED Nurse Name and Phone #: Karena Addison [phone]  S Name/Age/Gender Martin Watts How 73 y.o. male Room/Bed: ED08A/ED08A  Code Status   Code Status: Prior  Home/SNF/Other Home Patient oriented to: self, place, time and situation Is this baseline? Yes   Triage Complete: Triage complete  Chief Complaint fever and tachycardia [place]  Triage Note Patient was seen Sunday at [facility] and diagnosed with UTI and prescribed antibiotic. Reports hes gotten worse and now running fever and having burning/pain during urination   Allergies Allergies  Allergen Reactions  . Statins Other (See Comments)    Myalgia    Level of Care/Admitting Diagnosis ED Disposition    ED Disposition Condition Comment   Admit  The patient appears reasonably stabilized for admission considering the current resources, flow, and capabilities available in the ED at this time, and I doubt any other EMC requiring further screening and/or treatment in the ED prior to admission is  present.       B Medical/Surgery History Past Medical History:  Diagnosis Date  . Arthritis   . Diabetes mellitus without complication ([facility])    Past Surgical History:  Procedure Laterality Date  . BACK SURGERY    . ctr    . dental implant    . KNEE SURGERY Left   . REVERSE SHOULDER ARTHROPLASTY Left 12/03/2018   Procedure: REVERSE SHOULDER ARTHROPLASTY [employer] Comprehensive System;  Surgeon: Poggi, John J, MD;  Location: [facility] ORS;  Service: Orthopedics;  Laterality: Left;  . shoulder sugery       A IV Location/Drains/Wounds Patient Lines/Drains/Airways Status   Active Line/Drains/Airways    Name:   Placement date:   Placement time:   Site:   Days:   Peripheral IV 01/14/19 Right Antecubital   01/14/19    2233    Antecubital   less than 1   Incision (Closed) 12/03/18 Arm Left   12/03/18    1110     42           Intake/Output Last 24 hours No intake or output data in the 24 hours ending 01/14/19  2335  Labs/Imaging Results for orders placed or performed during the hospital encounter of 01/14/19 (from the past 48 hour(s))  Lactic acid, plasma     Status: Abnormal   Collection Time: 01/14/19  5:15 PM  Result Value Ref Range   Lactic Acid, Venous 2.6 (HH) 0.5 - 1.9 mmol/L    Comment: CRITICAL RESULT CALLED TO, READ BACK BY AND VERIFIED WITH Neal Dy RN AT 1031 ON 01/14/2019 SNG Performed at [facility], [address] [place], [place] [address]   Comprehensive metabolic panel     Status: Abnormal   Collection Time: 01/14/19  5:15 PM  Result Value Ref Range   Sodium 132 (L) 135 - 145 mmol/L   Potassium 3.9 3.5 - 5.1 mmol/L   Chloride 94 (L) 98 - 111 mmol/L   CO2 24 22 - 32 mmol/L   Glucose, Bld 298 (H) 70 - 99 mg/dL   BUN 11 8 - 23 mg/dL   Creatinine, Ser 0.79 0.61 - 1.24 mg/dL   Calcium 9.6 8.9 - 10.3 mg/dL   Total Protein 7.3 6.5 - 8.1 g/dL   Albumin 4.1 3.5 - 5.0 g/dL   AST 20 15 - 41 U/L   ALT 19 0 - 44 U/L   Alkaline Phosphatase 79 38 - 126 U/L   Total Bilirubin 0.6 0.3 - 1.2 mg/dL   GFR calc  non Af Amer >60 >60 mL/min   GFR calc Af Amer >60 >60 mL/min   Anion gap 14 5 - 15    Comment: Performed at [facility], [address] [place], [place] [address]  CBC with Differential     Status: Abnormal   Collection Time: 01/14/19  5:15 PM  Result Value Ref Range   WBC 16.3 (H) 4.0 - 10.5 K/uL   RBC 4.38 4.22 - 5.81 MIL/uL   Hemoglobin 13.3 13.0 - 17.0 g/dL   HCT 39.2 39.0 - 52.0 %   MCV 89.5 80.0 - 100.0 fL   MCH 30.4 26.0 - 34.0 pg   MCHC 33.9 30.0 - 36.0 g/dL   RDW 13.2 11.5 - 15.5 %   Platelets 325 150 - 400 K/uL   nRBC 0.0 0.0 - 0.2 %   Neutrophils Relative % 82 %   Neutro Abs 13.2 (H) 1.7 - 7.7 K/uL   Lymphocytes Relative 9 %   Lymphs Abs 1.5 0.7 - 4.0 K/uL   Monocytes Relative 9 %   Monocytes Absolute 1.5 (H) 0.1 - 1.0 K/uL   Eosinophils Relative 0 %   Eosinophils Absolute 0.0 0.0 - 0.5 K/uL   Basophils Relative 0 %    Basophils Absolute 0.0 0.0 - 0.1 K/uL   Immature Granulocytes 0 %   Abs Immature Granulocytes 0.07 0.00 - 0.07 K/uL    Comment: Performed at [facility], [address] [place], [place] [address]  Urinalysis, Complete w Microscopic     Status: Abnormal   Collection Time: 01/14/19  5:15 PM  Result Value Ref Range   Color, Urine ORANGE (A) YELLOW   APPearance CLEAR CLEAR   Specific Gravity, Urine 1.023 1.005 - 1.030   pH  5.0 - 8.0    TEST NOT REPORTED DUE TO COLOR INTERFERENCE OF URINE PIGMENT   Glucose, UA (A) NEGATIVE mg/dL    TEST NOT REPORTED DUE TO COLOR INTERFERENCE OF URINE PIGMENT   Hgb urine dipstick (A) NEGATIVE    TEST NOT REPORTED DUE TO COLOR INTERFERENCE OF URINE PIGMENT   Bilirubin Urine (A) NEGATIVE    TEST NOT REPORTED DUE TO COLOR INTERFERENCE OF URINE PIGMENT   Ketones, ur (A) NEGATIVE mg/dL    TEST NOT REPORTED DUE TO COLOR INTERFERENCE OF URINE PIGMENT   Protein, ur (A) NEGATIVE mg/dL    TEST NOT REPORTED DUE TO COLOR INTERFERENCE OF URINE PIGMENT   Nitrite (A) NEGATIVE    TEST NOT REPORTED DUE TO COLOR INTERFERENCE OF URINE PIGMENT   Leukocytes,Ua (A) NEGATIVE    TEST NOT REPORTED DUE TO COLOR INTERFERENCE OF URINE PIGMENT   RBC / HPF 11-20 0 - 5 RBC/hpf   WBC, UA 6-10 0 - 5 WBC/hpf   Bacteria, UA NONE SEEN NONE SEEN   Squamous Epithelial / LPF NONE SEEN 0 - 5    Comment: Performed at [facility], [address] [place], [place] [address]  Lactic acid, plasma     Status: None   Collection Time: 01/14/19 10:23 PM  Result Value Ref Range   Lactic Acid, Venous 1.6 0.5 - 1.9 mmol/L    Comment: Performed at [facility], [address] [place], [place] [address]   Dg Chest Portable 1 View  Result Date: 01/14/2019 CLINICAL DATA:  Possible sepsis EXAM: PORTABLE CHEST 1 VIEW COMPARISON:  None FINDINGS: Cardiac shadows within normal limits. The lungs are well aerated bilaterally. No focal infiltrate or sizable effusion is seen.  Left shoulder replacement is noted. IMPRESSION: No acute abnormality noted. Electronically Signed   By: Inez Catalina M.D.   On: 01/14/2019 22:48    Pending Labs Unresulted Labs (From admission, onward)    Start     Ordered   01/14/19 2223  Blood culture (routine x 2)  BLOOD CULTURE X 2,   STAT     01/14/19 2327   01/14/19 2215  SARS Coronavirus 2 (CEPHEID - Performed in [place] hospital lab), Hosp Order  (Asymptomatic Patients Labs)  Once,   STAT    Question:  Rule Out  Answer:  Yes   01/14/19 2214          Vitals/Pain Today's Vitals   01/14/19 2035 01/14/19 2234 01/14/19 2300 01/14/19 2330  BP: 131/82 140/83 134/76 133/77  Pulse: 90 86 81 81  Resp: 16 (!) 21 (!) 21 19  Temp: 99 F (37.2 C)     TempSrc: Oral     SpO2: 96% 95% 97% 97%  Weight:      Height:      PainSc:        Isolation Precautions No active isolations  Medications Medications  cefTRIAXone (ROCEPHIN) 1 g in sodium chloride 0.9 % 100 mL IVPB (1 g Intravenous New Bag/Given 01/14/19 2306)  acetaminophen (TYLENOL) tablet 650 mg (650 mg Oral Given 01/14/19 1713)  sodium chloride 0.9 % bolus 1,000 mL (1,000 mLs Intravenous New Bag/Given 01/14/19 2234)    Mobility walks Low fall risk   Focused Assessments    R Recommendations: See Admitting Provider Note  Report given to: Patsi Sears, RN

## 2019-01-14 NOTE — ED Provider Notes (Signed)
[facility] Emergency Department Provider Note ____________________________________________   First MD Initiated Contact with Patient 01/14/19 2214     (approximate)  I have reviewed the triage vital signs and the nursing notes.   HISTORY  Chief Complaint Fever and Urinary Tract Infection    HPI Martin Watts is a 73 y.o. male with PMH as noted below who presents with fever over the last day, associated with persistent dysuria and frequency since last week.  The patient states he was seen several days ago at [facility] and diagnosed with a UTI.  He was prescribed Ceftin ear but states his symptoms did not improve, and then the fever developed.  The patient denies nausea or vomiting, diarrhea, chest pain, shortness of breath, or cough.  He has no sick contacts.  Past Medical History:  Diagnosis Date  . Arthritis   . Diabetes mellitus without complication [facility])     Patient Active Problem List   Diagnosis Date Noted  . 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 BACK SURGERY    . ctr    . dental implant    . KNEE SURGERY Left   . REVERSE SHOULDER ARTHROPLASTY Left 12/03/2018   Procedure: REVERSE SHOULDER ARTHROPLASTY [employer];  Surgeon: Corky Mull, MD;  Location: [facility] ORS;  Service: Orthopedics;  Laterality: Left;  . shoulder sugery      Prior to Admission medications   Medication Sig Start Date End Date Taking? Authorizing Provider  Ascorbic Acid (VITAMIN C) 1000 MG tablet Take 1,000 mg by mouth daily.    [provider]  aspirin EC 325 MG tablet Take 1 tablet (325 mg total) by mouth daily. 12/04/18   Lattie Corns, PA-C  b complex vitamins capsule Take 1 capsule by mouth daily.    [provider]  ezetimibe (ZETIA) 10 MG tablet Take 10 mg by mouth daily. 07/05/18 07/05/19  [provider]  glipiZIDE (GLUCOTROL) 10 MG  tablet Take 20 mg by mouth 2 (two) times daily.  07/05/18   [provider]  metFORMIN (GLUCOPHAGE-XR) 500 MG 24 hr tablet Take 1,000 mg by mouth 2 (two) times daily with a meal. 07/29/18   [provider]  OVER THE COUNTER MEDICATION Take 1 tablet by mouth daily. [employer] Acupuncturist, Historical, MD  oxyCODONE (OXY IR/ROXICODONE) 5 MG immediate release tablet Take 1-2 tablets (5-10 mg total) by mouth every 4 (four) hours as needed for moderate pain. 12/04/18   Lattie Corns, PA-C    Allergies Statins  History reviewed. No pertinent family history.  Social History Social History   Tobacco Use  . Smoking status: Never Smoker  . Smokeless tobacco: Never Used  Substance Use Topics  . Alcohol use: No  . Drug use: Never    Review of Systems  Constitutional: Positive for fever. Eyes: No redness. ENT: No sore throat. Cardiovascular: Denies chest pain. Respiratory: Denies shortness of breath. Gastrointestinal: No vomiting or diarrhea.  Genitourinary: Positive for dysuria.  Musculoskeletal: Positive for bilateral back pain. Skin: Negative for rash. Neurological: Negative for headache.   ____________________________________________   PHYSICAL EXAM:  VITAL SIGNS: ED Triage Vitals [01/14/19 1708]  Enc Vitals Group     BP 134/88     Pulse Rate (!) 123     Resp 16     Temp (!) 100.7 F (38.2 C)     Temp  Source Oral     SpO2 95 %     Weight 165 lb (74.8 kg)     Height 5\' 8"  (1.727 m)     Head Circumference      Peak Flow      Pain Score 1     Pain Loc      Pain Edu?      Excl. in [place]?     Constitutional: Alert and oriented. Well appearing and in no acute distress. Eyes: Conjunctivae are normal.  Head: Atraumatic. Nose: No congestion/rhinnorhea. Mouth/Throat: Mucous membranes are moist.   Neck: Normal range of motion.  Cardiovascular: Borderline tachycardic, regular rhythm. Good peripheral circulation. Respiratory: Normal respiratory  effort.  No retractions.  Gastrointestinal: No distention.  Musculoskeletal: No lower extremity edema.  Extremities warm and well perfused.  Neurologic:  Normal speech and language. No gross focal neurologic deficits are appreciated.  Skin:  Skin is warm and dry. No rash noted. Psychiatric: Mood and affect are normal. Speech and behavior are normal.  ____________________________________________   LABS (all labs ordered are listed, but only abnormal results are displayed)  Labs Reviewed  LACTIC ACID, PLASMA - Abnormal; Notable for the following components:      Result Value   Lactic Acid, Venous 2.6 (*)    All other components within normal limits  COMPREHENSIVE METABOLIC PANEL - Abnormal; Notable for the following components:   Sodium 132 (*)    Chloride 94 (*)    Glucose, Bld 298 (*)    All other components within normal limits  CBC WITH DIFFERENTIAL/PLATELET - Abnormal; Notable for the following components:   WBC 16.3 (*)    Neutro Abs 13.2 (*)    Monocytes Absolute 1.5 (*)    All other components within normal limits  URINALYSIS, COMPLETE (UACMP) WITH MICROSCOPIC - Abnormal; Notable for the following components:   Color, Urine ORANGE (*)    Glucose, UA   (*)    Value: TEST NOT REPORTED DUE TO COLOR INTERFERENCE OF URINE PIGMENT   Hgb urine dipstick   (*)    Value: TEST NOT REPORTED DUE TO COLOR INTERFERENCE OF URINE PIGMENT   Bilirubin Urine   (*)    Value: TEST NOT REPORTED DUE TO COLOR INTERFERENCE OF URINE PIGMENT   Ketones, ur   (*)    Value: TEST NOT REPORTED DUE TO COLOR INTERFERENCE OF URINE PIGMENT   Protein, ur   (*)    Value: TEST NOT REPORTED DUE TO COLOR INTERFERENCE OF URINE PIGMENT   Nitrite   (*)    Value: TEST NOT REPORTED DUE TO COLOR INTERFERENCE OF URINE PIGMENT   Leukocytes,Ua   (*)    Value: TEST NOT REPORTED DUE TO COLOR INTERFERENCE OF URINE PIGMENT   All other components within normal limits  SARS CORONAVIRUS 2 (HOSPITAL ORDER, [place] [facility])  LACTIC ACID, PLASMA   ____________________________________________  EKG    ____________________________________________  RADIOLOGY  CXR: No focal infiltrate or other acute abnormality  ____________________________________________   PROCEDURES  Procedure(s) performed: No  Procedures  Critical Care performed: Yes  CRITICAL CARE Performed by: Arta Silence   Total critical care time: 20 minutes  Critical care time was exclusive of separately billable procedures and treating other patients.  Critical care was necessary to treat or prevent imminent or life-threatening deterioration.  Critical care was time spent personally by me on the following activities: development of treatment plan with patient and/or surrogate as well as nursing, discussions  with consultants, evaluation of patient's response to treatment, examination of patient, obtaining history from patient or surrogate, ordering and performing treatments and interventions, ordering and review of laboratory studies, ordering and review of radiographic studies, pulse oximetry and re-evaluation of patient's condition. ____________________________________________   INITIAL IMPRESSION / ASSESSMENT AND PLAN / ED COURSE  Pertinent labs & imaging results that were available during my care of the patient were reviewed by me and considered in my medical decision making (see chart for details).  73 year old male with PMH as noted above presents with persistent dysuria over the last week, associated with frequency as well as with lower back pain.  I reviewed the past medical records in epic.  He was evaluated at [facility] clinic on 7/19 and diagnosed with a UTI at that time.  He was started on Cefdinir and is taking Azo.  On exam the patient is overall very well-appearing.  He was initially tachycardic and febrile although his vital signs have now improved.  Presentation is consistent with a urinary  tract infection.  The patient's lab work-up is suggestive of sepsis, with elevated lactic acid and WBC count.  Since he has failed outpatient therapy, I will admit him for IV antibiotics.  He has no recent positive urine cultures and I am unable to see a culture from his recent visit.  We will start him on ceftriaxone.  ----------------------------------------- 11:03 PM on 01/14/2019 -----------------------------------------  I signed the patient out to the hospitalist for admission.   _________________________________  Martin Watts was evaluated in Emergency Department on 01/14/2019 for the symptoms described in the history of present illness. He was evaluated in the context of the global COVID-19 pandemic, which necessitated consideration that the patient might be at risk for infection with the SARS-CoV-2 virus that causes COVID-19. Institutional protocols and algorithms that pertain to the evaluation of patients at risk for COVID-19 are in a state of rapid change based on information released by regulatory bodies including the CDC and federal and state organizations. These policies and algorithms were followed during the patient's care in the ED.  ____________________________________________   FINAL CLINICAL IMPRESSION(S) / ED DIAGNOSES  Final diagnoses:  Sepsis, due to unspecified organism, unspecified whether acute organ dysfunction present [facility])  Urinary tract infection without hematuria, site unspecified      NEW MEDICATIONS STARTED DURING THIS VISIT:  New Prescriptions   No medications on file     Note:  This document was prepared using Dragon voice recognition software and may include unintentional dictation errors.    Arta Silence, MD 01/14/19 2303

## 2019-01-14 NOTE — ED Notes (Signed)
Pt reassessed and VS rechecked. Pt reports pain is still 1-2. Pt does not need anything further for comfort while he waits at this time. Will continue to assess.

## 2019-01-14 NOTE — ED Triage Notes (Signed)
Patient was seen Sunday at [facility] and diagnosed with UTI and prescribed antibiotic. Reports hes gotten worse and now running fever and having burning/pain during urination

## 2019-01-15 ENCOUNTER — Other Ambulatory Visit: Payer: Self-pay

## 2019-01-15 DIAGNOSIS — E119 Type 2 diabetes mellitus without complications: Secondary | ICD-10-CM | POA: Diagnosis not present

## 2019-01-15 DIAGNOSIS — N390 Urinary tract infection, site not specified: Secondary | ICD-10-CM | POA: Diagnosis not present

## 2019-01-15 DIAGNOSIS — A419 Sepsis, unspecified organism: Secondary | ICD-10-CM | POA: Diagnosis not present

## 2019-01-15 LAB — GLUCOSE, CAPILLARY
Glucose-Capillary: 250 mg/dL — ABNORMAL HIGH (ref 70–99)
Glucose-Capillary: 148 mg/dL — ABNORMAL HIGH (ref 70–99)
Glucose-Capillary: 286 mg/dL — ABNORMAL HIGH (ref 70–99)
Glucose-Capillary: 117 mg/dL — ABNORMAL HIGH (ref 70–99)

## 2019-01-15 LAB — BASIC METABOLIC PANEL
Anion gap: 8 (ref 5–15)
BUN: 10 mg/dL (ref 8–23)
CO2: 26 mmol/L (ref 22–32)
Calcium: 8.5 mg/dL — ABNORMAL LOW (ref 8.9–10.3)
Chloride: 99 mmol/L (ref 98–111)
Creatinine, Ser: 0.74 mg/dL (ref 0.61–1.24)
GFR calc Af Amer: >60 mL/min (ref >60)
GFR calc non Af Amer: >60 mL/min (ref >60)
Glucose, Bld: 310 mg/dL — ABNORMAL HIGH (ref 70–99)
Potassium: 4 mmol/L (ref 3.5–5.1)
Sodium: 133 mmol/L — ABNORMAL LOW (ref 135–145)

## 2019-01-15 LAB — TROPONIN I (HIGH SENSITIVITY)
Troponin I (High Sensitivity): 10 ng/L (ref <18)
Troponin I (High Sensitivity): 6 ng/L (ref <18)

## 2019-01-15 LAB — CBC
HCT: 35.8 % — ABNORMAL LOW (ref 39.0–52.0)
Hemoglobin: 11.8 g/dL — ABNORMAL LOW (ref 13.0–17.0)
MCH: 30.1 pg (ref 26.0–34.0)
MCHC: 33 g/dL (ref 30.0–36.0)
MCV: 91.3 fL (ref 80.0–100.0)
Platelets: 305 10*3/uL (ref 150–400)
RBC: 3.92 MIL/uL — ABNORMAL LOW (ref 4.22–5.81)
RDW: 13.2 % (ref 11.5–15.5)
WBC: 13.8 10*3/uL — ABNORMAL HIGH (ref 4.0–10.5)
nRBC: 0 % (ref 0.0–0.2)

## 2019-01-15 LAB — HEMOGLOBIN A1C
Hgb A1c MFr Bld: 8.1 % — ABNORMAL HIGH (ref 4.8–5.6)
Mean Plasma Glucose: 185.77 mg/dL

## 2019-01-15 MED ORDER — FUROSEMIDE 10 MG/ML IJ SOLN
40.0000 mg | Freq: Once | INTRAMUSCULAR | Status: DC
  Filled 2019-01-15: qty 4

## 2019-01-15 MED ORDER — INSULIN ASPART 100 UNIT/ML ~~LOC~~ SOLN
0.0000 [IU] | Freq: Three times a day (TID) | SUBCUTANEOUS | Status: DC
  Administered 2019-01-15: 3 [IU] via SUBCUTANEOUS
  Administered 2019-01-15: 7 [IU] via SUBCUTANEOUS
  Filled 2019-01-15 (×2): qty 1

## 2019-01-15 MED ORDER — INSULIN GLARGINE 100 UNIT/ML ~~LOC~~ SOLN
12.0000 [IU] | Freq: Every day | SUBCUTANEOUS | Status: DC
  Administered 2019-01-15 – 2019-01-16 (×2): 12 [IU] via SUBCUTANEOUS
  Filled 2019-01-15 (×2): qty 0.12

## 2019-01-15 MED ORDER — NITROGLYCERIN 0.4 MG SL SUBL: 0.4000 mg | SUBLINGUAL_TABLET | SUBLINGUAL | Status: DC | PRN

## 2019-01-15 MED ORDER — INSULIN ASPART 100 UNIT/ML ~~LOC~~ SOLN: 0.0000 [IU] | Freq: Every day | SUBCUTANEOUS | Status: DC

## 2019-01-15 MED ORDER — INSULIN ASPART 100 UNIT/ML ~~LOC~~ SOLN: 0.0000 [IU] | Freq: Three times a day (TID) | SUBCUTANEOUS | Status: DC

## 2019-01-15 MED ORDER — INSULIN ASPART 100 UNIT/ML ~~LOC~~ SOLN: 3.0000 [IU] | Freq: Three times a day (TID) | SUBCUTANEOUS | Status: DC

## 2019-01-15 MED ORDER — INSULIN ASPART 100 UNIT/ML ~~LOC~~ SOLN
3.0000 [IU] | Freq: Three times a day (TID) | SUBCUTANEOUS | Status: DC
  Administered 2019-01-15 – 2019-01-16 (×3): 3 [IU] via SUBCUTANEOUS
  Filled 2019-01-15 (×2): qty 1

## 2019-01-15 MED ORDER — INSULIN ASPART 100 UNIT/ML ~~LOC~~ SOLN
0.0000 [IU] | Freq: Three times a day (TID) | SUBCUTANEOUS | Status: DC
  Administered 2019-01-15: 8 [IU] via SUBCUTANEOUS
  Administered 2019-01-16 (×2): 3 [IU] via SUBCUTANEOUS
  Filled 2019-01-15 (×2): qty 1

## 2019-01-15 NOTE — Progress Notes (Signed)
[facility] [place] NAME: Graeden Bitner    MR#:  [id number]  DATE OF BIRTH:  04-25-1946  SUBJECTIVE:   Patient here with symptomatic UTI  REVIEW OF SYSTEMS:    Review of Systems  Constitutional: Negative for fever, chills weight loss HENT: Negative for ear pain, nosebleeds, congestion, facial swelling, rhinorrhea, neck pain, neck stiffness and ear discharge.   Respiratory: Negative for cough, shortness of breath, wheezing  Cardiovascular: Negative for chest pain, palpitations and leg swelling.  Gastrointestinal: Negative for heartburn, abdominal pain, vomiting, diarrhea or consitpation Genitourinary: Positive for dysuria and hesitancy  musculoskeletal: Negative for back pain or joint pain Neurological: Negative for dizziness, seizures, syncope, focal weakness,  numbness and headaches.  Hematological: Does not bruise/bleed easily.  Psychiatric/Behavioral: Negative for hallucinations, confusion, dysphoric mood    Tolerating Diet: yes      DRUG ALLERGIES:   Allergies  Allergen Reactions  . Statins Other (See Comments)    Myalgia    VITALS:  Blood pressure 134/79, pulse 85, temperature 97.8 F (36.6 C), temperature source Oral, resp. rate 16, height 5\' 8"  (1.727 m), weight 73.8 kg, SpO2 96 %.  PHYSICAL EXAMINATION:  Constitutional: Appears well-developed and well-nourished. No distress. HENT: Normocephalic. Marland Kitchen Oropharynx is clear and moist.  Eyes: Conjunctivae and EOM are normal. PERRLA, no scleral icterus.  Neck: Normal ROM. Neck supple. No JVD. No tracheal deviation. CVS: RRR, S1/S2 +, no murmurs, no gallops, no carotid bruit.  Pulmonary: Effort and breath sounds normal, no stridor, rhonchi, wheezes, rales.  Abdominal: Soft. BS +,  no distension, tenderness, rebound or guarding.  Musculoskeletal: Normal range of motion. No edema and no tenderness.  Neuro: Alert. CN 2-12 grossly intact. No focal deficits. Skin: Skin is warm  and dry. No rash noted. Psychiatric: Normal mood and affect.      LABORATORY PANEL:   CBC Recent Labs  Lab 01/15/19 0247  WBC 13.8*  HGB 11.8*  HCT 35.8*  PLT 305   ------------------------------------------------------------------------------------------------------------------  Chemistries  Recent Labs  Lab 01/14/19 1715 01/15/19 0247  NA 132* 133*  K 3.9 4.0  CL 94* 99  CO2 24 26  GLUCOSE 298* 310*  BUN 11 10  CREATININE 0.79 0.74  CALCIUM 9.6 8.5*  AST 20  --   ALT 19  --   ALKPHOS 79  --   BILITOT 0.6  --    ------------------------------------------------------------------------------------------------------------------  Cardiac Enzymes No results for input(s): TROPONINI in the last 168 hours. ------------------------------------------------------------------------------------------------------------------  RADIOLOGY:  Dg Chest Portable 1 View  Result Date: 01/14/2019 CLINICAL DATA:  Possible sepsis EXAM: PORTABLE CHEST 1 VIEW COMPARISON:  None FINDINGS: Cardiac shadows within normal limits. The lungs are well aerated bilaterally. No focal infiltrate or sizable effusion is seen. Left shoulder replacement is noted. IMPRESSION: No acute abnormality noted. Electronically Signed   By: Inez Catalina M.D.   On: 01/14/2019 22:48     ASSESSMENT AND PLAN:   73 year old male with history of diabetes who presented to the emergency room due to persistent dysuria.   1.  Sepsis: Patient presents with fever and tachycardia.  Sepsis due to urinary tract infection.  2.  Urinary tract infection: Continue Rocephin and follow-up on final urine culture. We will try to obtain urine culture from walk-in clinic.   3.  Diabetes: Continue ADA diet with recommendations as per diabetes coordinator.     Management plans discussed with the patient and he is in agreement.  CODE STATUS: full  TOTAL TIME TAKING CARE OF THIS PATIENT: 30 minutes.     POSSIBLE D/C tomorrow,  DEPENDING ON CLINICAL CONDITION.   Bettey Costa M.D on 01/15/2019 at 11:27 AM  Between 7am to 6pm - Pager - [phone] After 6pm go to [email or web address] - password [email or web address]  Office  [phone]  CC: Primary care physician; Dion Body, MD  Note: This dictation was prepared with Dragon dictation along with smaller phrase technology. Any transcriptional errors that result from this process are unintentional.

## 2019-01-15 NOTE — ED Notes (Signed)
Pt given crackers and water. 

## 2019-01-15 NOTE — Progress Notes (Signed)
Inpatient Diabetes Program Recommendations  AACE/ADA: New Consensus Statement on Inpatient Glycemic Control (2015)  Target Ranges:  Prepandial:   less than 140 mg/dL      Peak postprandial:   less than 180 mg/dL (1-2 hours)      Critically ill patients:  140 - 180 mg/dL   Lab Results  Component Value Date   GLUCAP 250 (H) 01/15/2019   HGBA1C 8.1 (H) 01/15/2019    Review of Glycemic Control Results for Martin Watts, Martin Watts (MRN [id number]) as of 01/15/2019 10:51  Ref. Range 12/03/2018 17:15 12/03/2018 21:01 12/04/2018 07:40 01/15/2019 08:46  Glucose-Capillary Latest Ref Range: 70 - 99 mg/dL 255 (H) 299 (H) 150 (H) 250 (H)   Diabetes history: DM 2 Outpatient Diabetes medications:  Metformin 1000 mg bid, Glipizide 20 mg bid Current orders for Inpatient glycemic control:  Novolog resistant tid with meals and HS Glipizide 10 mg bid  Inpatient Diabetes Program Recommendations:    While in the hospital, consider adding Lantus 12 units daily.  Also consider d/c of Glucotrol and add Novolog 3 units tid with meals.  Please reduce Novolog correction to moderate.    Thanks  Adah Perl, RN, BC-ADM Inpatient Diabetes Coordinator Pager [phone] (8a-5p)

## 2019-01-15 NOTE — H&P (Signed)
[place] at [place] NAME: Martin Watts    MR#:  [id number]  DATE OF BIRTH:  1945-08-25  DATE OF ADMISSION:  01/14/2019  PRIMARY CARE PHYSICIAN: Dion Body, MD   REQUESTING/REFERRING PHYSICIAN: Arta Silence, MD  CHIEF COMPLAINT:   Chief Complaint  Patient presents with  . Fever  . Urinary Tract Infection    HISTORY OF PRESENT ILLNESS:  Martin Watts  is a 73 y.o. Caucasian male with a known history of catarrhalis mellitus and osteoarthritis, presented to the emergency room with concern of persistent dysuria with associated urinary frequency and urgency without hematuria as well as fever that was up to 101.8.  He has been having occasional bilateral flank pain.  He denied any chest pain or dyspnea or cough or wheezing.  No recent sick exposures or exposure to COVID-19.  No nausea vomiting or abdominal pain.  He was seen in a walk-in clinic on Sunday and was diagnosed with UTI for which she was given cefdinir which she has been taking till today without significant overall improvement.  His fever has improved however.  Upon presentation to the emergency room, temperature was 100.7 with heart rate of 123 and later on 90 with otherwise normal vital signs.  After p.o. Tylenol 650 mg temperature was down to 99 and later 98.9.  Labs revealed hyperglycemia of 298 and anion gap of 14 with CO2 24.  Lactic acid was 2.6 and later 1.6 and CBC showed leukocytosis of 16.3 with neutrophilia.  Urinalysis was positive for UTI and portable chest x-ray showed no acute cardiopulmonary disease.  The patient was given a gram of IV Rocephin and 1 L bolus of IV normal saline in addition to Tylenol.  He will be admitted to medical monitored bed for further evaluation and management.   PAST MEDICAL HISTORY:   Past Medical History:  Diagnosis Date  . Arthritis   . Diabetes mellitus without complication ([place])     PAST SURGICAL HISTORY:   Past  Surgical History:  Procedure Laterality Date  . BACK SURGERY    . ctr    . dental implant    . KNEE SURGERY Left   . REVERSE SHOULDER ARTHROPLASTY Left 12/03/2018   Procedure: REVERSE SHOULDER ARTHROPLASTY [employer] [facility];  Surgeon: Corky Mull, MD;  Location: [facility] ORS;  Service: Orthopedics;  Laterality: Left;  . shoulder sugery      SOCIAL HISTORY:   Social History   Tobacco Use  . Smoking status: Never Smoker  . Smokeless tobacco: Never Used  Substance Use Topics  . Alcohol use: No    FAMILY HISTORY:  His mother had CVA  DRUG ALLERGIES:   Allergies  Allergen Reactions  . Statins Other (See Comments)    Myalgia    REVIEW OF SYSTEMS:   ROS As per history of present illness. All pertinent systems were reviewed above. Constitutional,  HEENT, cardiovascular, respiratory, GI, GU, musculoskeletal, neuro, psychiatric, endocrine,  integumentary and hematologic systems were reviewed and are otherwise  negative/unremarkable except for positive findings mentioned above in the HPI.   MEDICATIONS AT HOME:   Prior to Admission medications   Medication Sig Start Date End Date Taking? Authorizing Provider  Ascorbic Acid (VITAMIN C) 1000 MG tablet Take 1,000 mg by mouth daily.   Yes [provider]  aspirin EC 81 MG tablet Take 81 mg by mouth daily.   Yes [provider]  b complex vitamins capsule Take 1 capsule by  mouth daily.   Yes [provider]  cefdinir (OMNICEF) 300 MG capsule Take 1 capsule by mouth 2 (two) times a day. 01/12/19 01/22/19 Yes [provider]  ezetimibe (ZETIA) 10 MG tablet Take 10 mg by mouth daily. 07/05/18 07/05/19 Yes [provider]  glipiZIDE (GLUCOTROL) 10 MG tablet Take 20 mg by mouth 2 (two) times daily.  07/05/18  Yes [provider]  metFORMIN (GLUCOPHAGE-XR) 500 MG 24 hr tablet Take 1,000 mg by mouth 2 (two) times daily with a meal. 07/29/18  Yes [provider]  OVER THE  COUNTER MEDICATION Take 1 tablet by mouth daily. [employer]   Yes [provider]  oxyCODONE (OXY IR/ROXICODONE) 5 MG immediate release tablet Take 1-2 tablets (5-10 mg total) by mouth every 4 (four) hours as needed for moderate pain. Patient not taking: Reported on 01/14/2019 12/04/18   Lattie Corns, PA-C      VITAL SIGNS:  Blood pressure 133/77, pulse 81, temperature 99 F (37.2 C), temperature source Oral, resp. rate 19, height 5\' 8"  (1.727 m), weight 74.8 kg, SpO2 97 %.  PHYSICAL EXAMINATION:  Physical Exam  GENERAL:  73 y.o.-year-old Caucasian male patient lying in the bed with no acute distress.  EYES: Pupils equal, round, reactive to light and accommodation. No scleral icterus. Extraocular muscles intact.  HEENT: Head atraumatic, normocephalic. Oropharynx and nasopharynx clear.  NECK:  Supple, no jugular venous distention. No thyroid enlargement, no tenderness.  LUNGS: Normal breath sounds bilaterally, no wheezing, rales,rhonchi or crepitation. No use of accessory muscles of respiration.  CARDIOVASCULAR: Regular rate and rhythm, S1, S2 normal. No murmurs, rubs, or gallops.  ABDOMEN: Soft, nondistended, nontender. Bowel sounds present. No organomegaly or mass.  He has bilateral CVA tenderness. EXTREMITIES: No pedal edema, cyanosis, or clubbing.  NEUROLOGIC: Cranial nerves II through XII are intact. Muscle strength 5/5 in all extremities. Sensation intact. Gait not checked.  PSYCHIATRIC: The patient is alert and oriented x 3.  Normal affect and good eye contact. SKIN: No obvious rash, lesion, or ulcer.   LABORATORY PANEL:   CBC Recent Labs  Lab 01/14/19 1715  WBC 16.3*  HGB 13.3  HCT 39.2  PLT 325   ------------------------------------------------------------------------------------------------------------------  Chemistries  Recent Labs  Lab 01/14/19 1715  NA 132*  K 3.9  CL 94*  CO2 24  GLUCOSE 298*  BUN 11  CREATININE 0.79  CALCIUM 9.6   AST 20  ALT 19  ALKPHOS 79  BILITOT 0.6   ------------------------------------------------------------------------------------------------------------------  Cardiac Enzymes No results for input(s): TROPONINI in the last 168 hours. ------------------------------------------------------------------------------------------------------------------  RADIOLOGY:  Dg Chest Portable 1 View  Result Date: 01/14/2019 CLINICAL DATA:  Possible sepsis EXAM: PORTABLE CHEST 1 VIEW COMPARISON:  None FINDINGS: Cardiac shadows within normal limits. The lungs are well aerated bilaterally. No focal infiltrate or sizable effusion is seen. Left shoulder replacement is noted. IMPRESSION: No acute abnormality noted. Electronically Signed   By: Inez Catalina M.D.   On: 01/14/2019 22:48      IMPRESSION AND PLAN:   1.  Sepsis.  This is clearly secondary to UTI.  The patient was admitted to a medical monitored bed.  We will continue him on IV Rocephin and follow urine culture and sensitivity as well as blood cultures.  2.  UTI.  Management as above.  3.  Uncontrolled type 2 diabetes mellitus.  We will place him on supplemental coverage with NovoLog and hold off his metformin while continue glipizide.  4.  DVT prophylaxis.  Subcutaneous Lovenox   All the records are reviewed and case discussed with ED provider. The plan of care was discussed in details with the patient (and family). I answered all questions. The patient agreed to proceed with the above mentioned plan. Further management will depend upon hospital course.   CODE STATUS: Full code  TOTAL TIME TAKING CARE OF THIS PATIENT: 50 minutes.    Christel Mormon M.D on 01/15/2019 at 12:01 AM  Pager - [phone]  After 6pm go to [email or web address] - Proofreader  [employer] [place] [facility]  Office  [phone]  CC: Primary care physician; Dion Body, MD   Note: This dictation was prepared with Dragon dictation along with  smaller phrase technology. Any transcriptional errors that result from this process are unintentional.

## 2019-01-16 DIAGNOSIS — N390 Urinary tract infection, site not specified: Secondary | ICD-10-CM | POA: Diagnosis not present

## 2019-01-16 DIAGNOSIS — A419 Sepsis, unspecified organism: Secondary | ICD-10-CM | POA: Diagnosis not present

## 2019-01-16 DIAGNOSIS — E119 Type 2 diabetes mellitus without complications: Secondary | ICD-10-CM | POA: Diagnosis not present

## 2019-01-16 LAB — URINE CULTURE: Culture: NO GROWTH

## 2019-01-16 LAB — GLUCOSE, CAPILLARY
Glucose-Capillary: 199 mg/dL — ABNORMAL HIGH (ref 70–99)
Glucose-Capillary: 195 mg/dL — ABNORMAL HIGH (ref 70–99)

## 2019-01-16 NOTE — Discharge Summary (Signed)
[facility] NAME: Martin Watts    MR#:  [id number]  DATE OF BIRTH:  1946/04/17  DATE OF ADMISSION:  01/14/2019 ADMITTING PHYSICIAN: Christel Mormon, MD  DATE OF DISCHARGE: 01/16/2019  PRIMARY CARE PHYSICIAN: Dion Body, MD    ADMISSION DIAGNOSIS:  Urinary tract infection without hematuria, site unspecified [N39.0] Sepsis, due to unspecified organism, unspecified whether acute organ dysfunction present ([place]) [A41.9]  DISCHARGE DIAGNOSIS:  Active Problems:   Sepsis ([place])   UTI (urinary tract infection)   SECONDARY DIAGNOSIS:   Past Medical History:  Diagnosis Date  . Arthritis   . Diabetes mellitus without complication ([place])   . Diastolic CHF [facility])     HOSPITAL COURSE:   73 year old male with history of diabetes who presented to the emergency room due to persistent dysuria.   1.  Sepsis: Patient presented with fever and tachycardia.   Sepsis was due to UTI.  Sepsis has resolved. 2.  Urinary tract infection: He was started on Rocephin.  Urine culture was negative however it should be noted that patient had been on cefdinir prior to admission to the hospital.  His symptoms have subsided.  I think that patient should just continue on cefdinir to complete his course of therapy.   3.  Diabetes: Continue ADA diet  DISCHARGE CONDITIONS AND DIET:   Stable for discharge diabetic diet  CONSULTS OBTAINED:    DRUG ALLERGIES:   Allergies  Allergen Reactions  . Statins Other (See Comments)    Myalgia    DISCHARGE MEDICATIONS:   Allergies as of 01/16/2019      Reactions   Statins Other (See Comments)   Myalgia      Medication List    STOP taking these medications   oxyCODONE 5 MG immediate release tablet Commonly known as: Oxy IR/ROXICODONE     TAKE these medications   aspirin EC 81 MG tablet Take 81 mg by mouth daily.   b complex vitamins capsule Take 1 capsule by mouth daily.   cefdinir 300 MG  capsule Commonly known as: OMNICEF Take 1 capsule by mouth 2 (two) times a day.   ezetimibe 10 MG tablet Commonly known as: ZETIA Take 10 mg by mouth daily.   glipiZIDE 10 MG tablet Commonly known as: GLUCOTROL Take 20 mg by mouth 2 (two) times daily.   metFORMIN 500 MG 24 hr tablet Commonly known as: GLUCOPHAGE-XR Take 1,000 mg by mouth 2 (two) times daily with a meal.   OVER THE COUNTER MEDICATION Take 1 tablet by mouth daily. Green Lipped Mussel   vitamin C 1000 MG tablet Take 1,000 mg by mouth daily.         Today   CHIEF COMPLAINT:   Denies dysuria, frequency urgency.  Feels much better than yesterday.   VITAL SIGNS:  Blood pressure 114/74, pulse 79, temperature 97.9 F (36.6 C), temperature source Oral, resp. rate 18, height 5\' 8"  (1.727 m), weight 73.8 kg, SpO2 97 %.   REVIEW OF SYSTEMS:  Review of Systems  Constitutional: Negative.  Negative for chills, fever and malaise/fatigue.  HENT: Negative.  Negative for ear discharge, ear pain, hearing loss, nosebleeds and sore throat.   Eyes: Negative.  Negative for blurred vision and pain.  Respiratory: Negative.  Negative for cough, hemoptysis, shortness of breath and wheezing.   Cardiovascular: Negative.  Negative for chest pain, palpitations and leg swelling.  Gastrointestinal: Negative.  Negative for abdominal pain, blood in stool, diarrhea, nausea  and vomiting.  Genitourinary: Negative.  Negative for dysuria.  Musculoskeletal: Negative.  Negative for back pain.  Skin: Negative.   Neurological: Negative for dizziness, tremors, speech change, focal weakness, seizures and headaches.  Endo/Heme/Allergies: Negative.  Does not bruise/bleed easily.  Psychiatric/Behavioral: Negative.  Negative for depression, hallucinations and suicidal ideas.     PHYSICAL EXAMINATION:  GENERAL:  73 y.o.-year-old patient lying in the bed with no acute distress.  NECK:  Supple, no jugular venous distention. No thyroid  enlargement, no tenderness.  LUNGS: Normal breath sounds bilaterally, no wheezing, rales,rhonchi  No use of accessory muscles of respiration.  CARDIOVASCULAR: S1, S2 normal. No murmurs, rubs, or gallops.  ABDOMEN: Soft, non-tender, non-distended. Bowel sounds present. No organomegaly or mass.  EXTREMITIES: No pedal edema, cyanosis, or clubbing.  PSYCHIATRIC: The patient is alert and oriented x 3.  SKIN: No obvious rash, lesion, or ulcer.   DATA REVIEW:   CBC Recent Labs  Lab 01/15/19 0247  WBC 13.8*  HGB 11.8*  HCT 35.8*  PLT 305    Chemistries  Recent Labs  Lab 01/14/19 1715 01/15/19 0247  NA 132* 133*  K 3.9 4.0  CL 94* 99  CO2 24 26  GLUCOSE 298* 310*  BUN 11 10  CREATININE 0.79 0.74  CALCIUM 9.6 8.5*  AST 20  --   ALT 19  --   ALKPHOS 79  --   BILITOT 0.6  --     Cardiac Enzymes No results for input(s): TROPONINI in the last 168 hours.  Microbiology Results  @MICRORSLT48 @  RADIOLOGY:  Dg Chest Portable 1 View  Result Date: 01/14/2019 CLINICAL DATA:  Possible sepsis EXAM: PORTABLE CHEST 1 VIEW COMPARISON:  None FINDINGS: Cardiac shadows within normal limits. The lungs are well aerated bilaterally. No focal infiltrate or sizable effusion is seen. Left shoulder replacement is noted. IMPRESSION: No acute abnormality noted. Electronically Signed   By: Inez Catalina M.D.   On: 01/14/2019 22:48      Allergies as of 01/16/2019      Reactions   Statins Other (See Comments)   Myalgia      Medication List    STOP taking these medications   oxyCODONE 5 MG immediate release tablet Commonly known as: Oxy IR/ROXICODONE     TAKE these medications   aspirin EC 81 MG tablet Take 81 mg by mouth daily.   b complex vitamins capsule Take 1 capsule by mouth daily.   cefdinir 300 MG capsule Commonly known as: OMNICEF Take 1 capsule by mouth 2 (two) times a day.   ezetimibe 10 MG tablet Commonly known as: ZETIA Take 10 mg by mouth daily.   glipiZIDE 10 MG  tablet Commonly known as: GLUCOTROL Take 20 mg by mouth 2 (two) times daily.   metFORMIN 500 MG 24 hr tablet Commonly known as: GLUCOPHAGE-XR Take 1,000 mg by mouth 2 (two) times daily with a meal.   OVER THE COUNTER MEDICATION Take 1 tablet by mouth daily. Green Lipped Mussel   vitamin C 1000 MG tablet Take 1,000 mg by mouth daily.           Management plans discussed with the patient and he is in agreement. Stable for discharge home  Patient should follow up with pcp  CODE STATUS:     Code Status Orders  (From admission, onward)         Start     Ordered   01/14/19 2355  Full code  Continuous     01/14/19  2354        Code Status History    Date Active Date Inactive Code Status Order ID Comments User Context   12/03/2018 1320 12/04/2018 1354 Full Code [id number]  Poggi, Marshall Cork, MD Inpatient   Advance Care Planning Activity    Advance Directive Documentation     Most Recent Value  Type of Advance Directive  Living will  Pre-existing out of facility DNR order (yellow form or pink MOST form)  -  "MOST" Form in Place?  -      TOTAL TIME TAKING CARE OF THIS PATIENT: 38 minutes.    Note: This dictation was prepared with Dragon dictation along with smaller phrase technology. Any transcriptional errors that result from this process are unintentional.  Bettey Costa M.D on 01/16/2019 at 11:23 AM  Between 7am to 6pm - Pager - [phone] After 6pm go to [email or web address] - password [email or web address] [facility]  Office  [phone]  CC: Primary care physician; Dion Body, MD

## 2019-01-16 NOTE — TOC Transition Note (Signed)
Transition of Care [facility]) - CM/SW Discharge Note   Patient Details  Name: Martin Watts MRN: [id number] Date of Birth: [birth year]  Transition of Care [facility]) CM/SW Contact:  Su Hilt, RN Phone Number: 01/16/2019, 10:33 AM   Clinical Narrative:     Met with the patient and completed Code 79, Patient lives at home with his wife, he drives and she is able to provide back up transportation, he can afford his medications with no problem and is up to date with his PCP, no needs at this time  Final next level of care: Home/Self Care Barriers to Discharge: Barriers Resolved   Patient Goals and CMS Choice Patient states their goals for this hospitalization and ongoing recovery are:: go home      Discharge Placement                       Discharge Plan and Services                                     Social Determinants of Health (SDOH) Interventions     Readmission Risk Interventions No flowsheet data found.

## 2019-01-16 NOTE — Care Management CC44 (Signed)
Condition Code 44 Documentation Completed  Patient Details  Name: Martin Watts MRN: [id number] Date of Birth: 1946-06-08   Condition Code 44 given:  Yes Patient signature on Condition Code 44 notice:  Yes Documentation of 2 MD's agreement:  Yes Code 44 added to claim:  Yes    Su Hilt, RN 01/16/2019, 10:17 AM

## 2019-01-16 NOTE — Progress Notes (Signed)
Patient discharged home with wife, discharge instructions given. Patient reports no questions. Lupita Leash

## 2019-01-17 DIAGNOSIS — M7582 Other shoulder lesions, left shoulder: Secondary | ICD-10-CM | POA: Diagnosis not present

## 2019-01-17 DIAGNOSIS — Z96612 Presence of left artificial shoulder joint: Secondary | ICD-10-CM | POA: Diagnosis not present

## 2019-01-17 DIAGNOSIS — M25512 Pain in left shoulder: Secondary | ICD-10-CM | POA: Diagnosis not present

## 2019-01-17 DIAGNOSIS — M75122 Complete rotator cuff tear or rupture of left shoulder, not specified as traumatic: Secondary | ICD-10-CM | POA: Diagnosis not present

## 2019-01-19 LAB — CULTURE, BLOOD (ROUTINE X 2)
Culture: NO GROWTH
Culture: NO GROWTH
Special Requests: ADEQUATE

## 2019-01-23 DIAGNOSIS — E1169 Type 2 diabetes mellitus with other specified complication: Secondary | ICD-10-CM | POA: Diagnosis not present

## 2019-01-23 DIAGNOSIS — Z87440 Personal history of urinary (tract) infections: Secondary | ICD-10-CM | POA: Diagnosis not present

## 2019-01-23 DIAGNOSIS — E785 Hyperlipidemia, unspecified: Secondary | ICD-10-CM | POA: Diagnosis not present

## 2019-01-24 DIAGNOSIS — Z96612 Presence of left artificial shoulder joint: Secondary | ICD-10-CM | POA: Diagnosis not present

## 2019-01-31 DIAGNOSIS — Z96612 Presence of left artificial shoulder joint: Secondary | ICD-10-CM | POA: Diagnosis not present

## 2019-01-31 DIAGNOSIS — M25512 Pain in left shoulder: Secondary | ICD-10-CM | POA: Diagnosis not present

## 2019-02-03 DIAGNOSIS — Z96612 Presence of left artificial shoulder joint: Secondary | ICD-10-CM | POA: Diagnosis not present

## 2019-02-03 DIAGNOSIS — M25512 Pain in left shoulder: Secondary | ICD-10-CM | POA: Diagnosis not present

## 2019-02-07 DIAGNOSIS — Z96612 Presence of left artificial shoulder joint: Secondary | ICD-10-CM | POA: Diagnosis not present

## 2019-02-07 DIAGNOSIS — M25512 Pain in left shoulder: Secondary | ICD-10-CM | POA: Diagnosis not present

## 2019-02-10 DIAGNOSIS — Z96612 Presence of left artificial shoulder joint: Secondary | ICD-10-CM | POA: Diagnosis not present

## 2019-02-14 DIAGNOSIS — L821 Other seborrheic keratosis: Secondary | ICD-10-CM | POA: Diagnosis not present

## 2019-02-14 DIAGNOSIS — D2272 Melanocytic nevi of left lower limb, including hip: Secondary | ICD-10-CM | POA: Diagnosis not present

## 2019-02-14 DIAGNOSIS — D2271 Melanocytic nevi of right lower limb, including hip: Secondary | ICD-10-CM | POA: Diagnosis not present

## 2019-02-14 DIAGNOSIS — M25512 Pain in left shoulder: Secondary | ICD-10-CM | POA: Diagnosis not present

## 2019-02-14 DIAGNOSIS — D2262 Melanocytic nevi of left upper limb, including shoulder: Secondary | ICD-10-CM | POA: Diagnosis not present

## 2019-02-14 DIAGNOSIS — D485 Neoplasm of uncertain behavior of skin: Secondary | ICD-10-CM | POA: Diagnosis not present

## 2019-02-14 DIAGNOSIS — D225 Melanocytic nevi of trunk: Secondary | ICD-10-CM | POA: Diagnosis not present

## 2019-02-14 DIAGNOSIS — D2261 Melanocytic nevi of right upper limb, including shoulder: Secondary | ICD-10-CM | POA: Diagnosis not present

## 2019-02-14 DIAGNOSIS — Z96612 Presence of left artificial shoulder joint: Secondary | ICD-10-CM | POA: Diagnosis not present

## 2019-02-17 DIAGNOSIS — M25512 Pain in left shoulder: Secondary | ICD-10-CM | POA: Diagnosis not present

## 2019-02-17 DIAGNOSIS — Z96612 Presence of left artificial shoulder joint: Secondary | ICD-10-CM | POA: Diagnosis not present

## 2019-02-17 DIAGNOSIS — H40013 Open angle with borderline findings, low risk, bilateral: Secondary | ICD-10-CM | POA: Diagnosis not present

## 2019-02-17 DIAGNOSIS — E119 Type 2 diabetes mellitus without complications: Secondary | ICD-10-CM | POA: Diagnosis not present

## 2019-02-17 DIAGNOSIS — H2513 Age-related nuclear cataract, bilateral: Secondary | ICD-10-CM | POA: Diagnosis not present

## 2019-02-21 DIAGNOSIS — M25512 Pain in left shoulder: Secondary | ICD-10-CM | POA: Diagnosis not present

## 2019-02-21 DIAGNOSIS — Z96612 Presence of left artificial shoulder joint: Secondary | ICD-10-CM | POA: Diagnosis not present

## 2019-02-24 DIAGNOSIS — Z96612 Presence of left artificial shoulder joint: Secondary | ICD-10-CM | POA: Diagnosis not present

## 2019-02-24 DIAGNOSIS — M25512 Pain in left shoulder: Secondary | ICD-10-CM | POA: Diagnosis not present

## 2019-02-25 DIAGNOSIS — M25512 Pain in left shoulder: Secondary | ICD-10-CM | POA: Diagnosis not present

## 2019-03-07 DIAGNOSIS — E7800 Pure hypercholesterolemia, unspecified: Secondary | ICD-10-CM | POA: Diagnosis not present

## 2019-03-07 DIAGNOSIS — E785 Hyperlipidemia, unspecified: Secondary | ICD-10-CM | POA: Diagnosis not present

## 2019-03-07 DIAGNOSIS — Z96612 Presence of left artificial shoulder joint: Secondary | ICD-10-CM | POA: Diagnosis not present

## 2019-03-07 DIAGNOSIS — R030 Elevated blood-pressure reading, without diagnosis of hypertension: Secondary | ICD-10-CM | POA: Diagnosis not present

## 2019-03-07 DIAGNOSIS — E1169 Type 2 diabetes mellitus with other specified complication: Secondary | ICD-10-CM | POA: Diagnosis not present

## 2019-03-14 DIAGNOSIS — E1169 Type 2 diabetes mellitus with other specified complication: Secondary | ICD-10-CM | POA: Diagnosis not present

## 2019-03-14 DIAGNOSIS — E7800 Pure hypercholesterolemia, unspecified: Secondary | ICD-10-CM | POA: Diagnosis not present

## 2019-03-22 DIAGNOSIS — R69 Illness, unspecified: Secondary | ICD-10-CM | POA: Diagnosis not present

## 2019-03-25 DIAGNOSIS — R69 Illness, unspecified: Secondary | ICD-10-CM | POA: Diagnosis not present

## 2019-03-28 DIAGNOSIS — S42125D Nondisplaced fracture of acromial process, left shoulder, subsequent encounter for fracture with routine healing: Secondary | ICD-10-CM | POA: Diagnosis not present

## 2019-03-28 DIAGNOSIS — Z96612 Presence of left artificial shoulder joint: Secondary | ICD-10-CM | POA: Diagnosis not present

## 2019-04-11 DIAGNOSIS — Z96612 Presence of left artificial shoulder joint: Secondary | ICD-10-CM | POA: Diagnosis not present

## 2019-04-11 DIAGNOSIS — M25612 Stiffness of left shoulder, not elsewhere classified: Secondary | ICD-10-CM | POA: Diagnosis not present

## 2019-04-11 DIAGNOSIS — M25512 Pain in left shoulder: Secondary | ICD-10-CM | POA: Diagnosis not present

## 2019-04-11 DIAGNOSIS — M6281 Muscle weakness (generalized): Secondary | ICD-10-CM | POA: Diagnosis not present

## 2019-04-14 DIAGNOSIS — M25512 Pain in left shoulder: Secondary | ICD-10-CM | POA: Diagnosis not present

## 2019-04-14 DIAGNOSIS — Z96612 Presence of left artificial shoulder joint: Secondary | ICD-10-CM | POA: Diagnosis not present

## 2019-04-18 DIAGNOSIS — M25512 Pain in left shoulder: Secondary | ICD-10-CM | POA: Diagnosis not present

## 2019-04-18 DIAGNOSIS — Z96612 Presence of left artificial shoulder joint: Secondary | ICD-10-CM | POA: Diagnosis not present

## 2019-04-21 DIAGNOSIS — Z96612 Presence of left artificial shoulder joint: Secondary | ICD-10-CM | POA: Diagnosis not present

## 2019-04-21 DIAGNOSIS — M25512 Pain in left shoulder: Secondary | ICD-10-CM | POA: Diagnosis not present

## 2019-04-25 DIAGNOSIS — Z96612 Presence of left artificial shoulder joint: Secondary | ICD-10-CM | POA: Diagnosis not present

## 2019-04-28 DIAGNOSIS — Z96612 Presence of left artificial shoulder joint: Secondary | ICD-10-CM | POA: Diagnosis not present

## 2019-04-28 DIAGNOSIS — M25512 Pain in left shoulder: Secondary | ICD-10-CM | POA: Diagnosis not present

## 2019-05-02 DIAGNOSIS — M25512 Pain in left shoulder: Secondary | ICD-10-CM | POA: Diagnosis not present

## 2019-05-02 DIAGNOSIS — Z96612 Presence of left artificial shoulder joint: Secondary | ICD-10-CM | POA: Diagnosis not present

## 2019-05-02 DIAGNOSIS — S42125D Nondisplaced fracture of acromial process, left shoulder, subsequent encounter for fracture with routine healing: Secondary | ICD-10-CM | POA: Diagnosis not present

## 2019-05-09 DIAGNOSIS — Z96612 Presence of left artificial shoulder joint: Secondary | ICD-10-CM | POA: Diagnosis not present

## 2019-05-09 DIAGNOSIS — M25512 Pain in left shoulder: Secondary | ICD-10-CM | POA: Diagnosis not present

## 2019-06-07 DIAGNOSIS — R69 Illness, unspecified: Secondary | ICD-10-CM | POA: Diagnosis not present

## 2019-06-09 DIAGNOSIS — R69 Illness, unspecified: Secondary | ICD-10-CM | POA: Diagnosis not present

## 2019-11-01 IMAGING — DX PORTABLE CHEST - 1 VIEW
2 series · 2 of 2 positions shown · non-contrast
Comparison: None

CLINICAL DATA: Possible sepsis

EXAM:
PORTABLE CHEST 1 VIEW

[chest ap (1 of 2)]
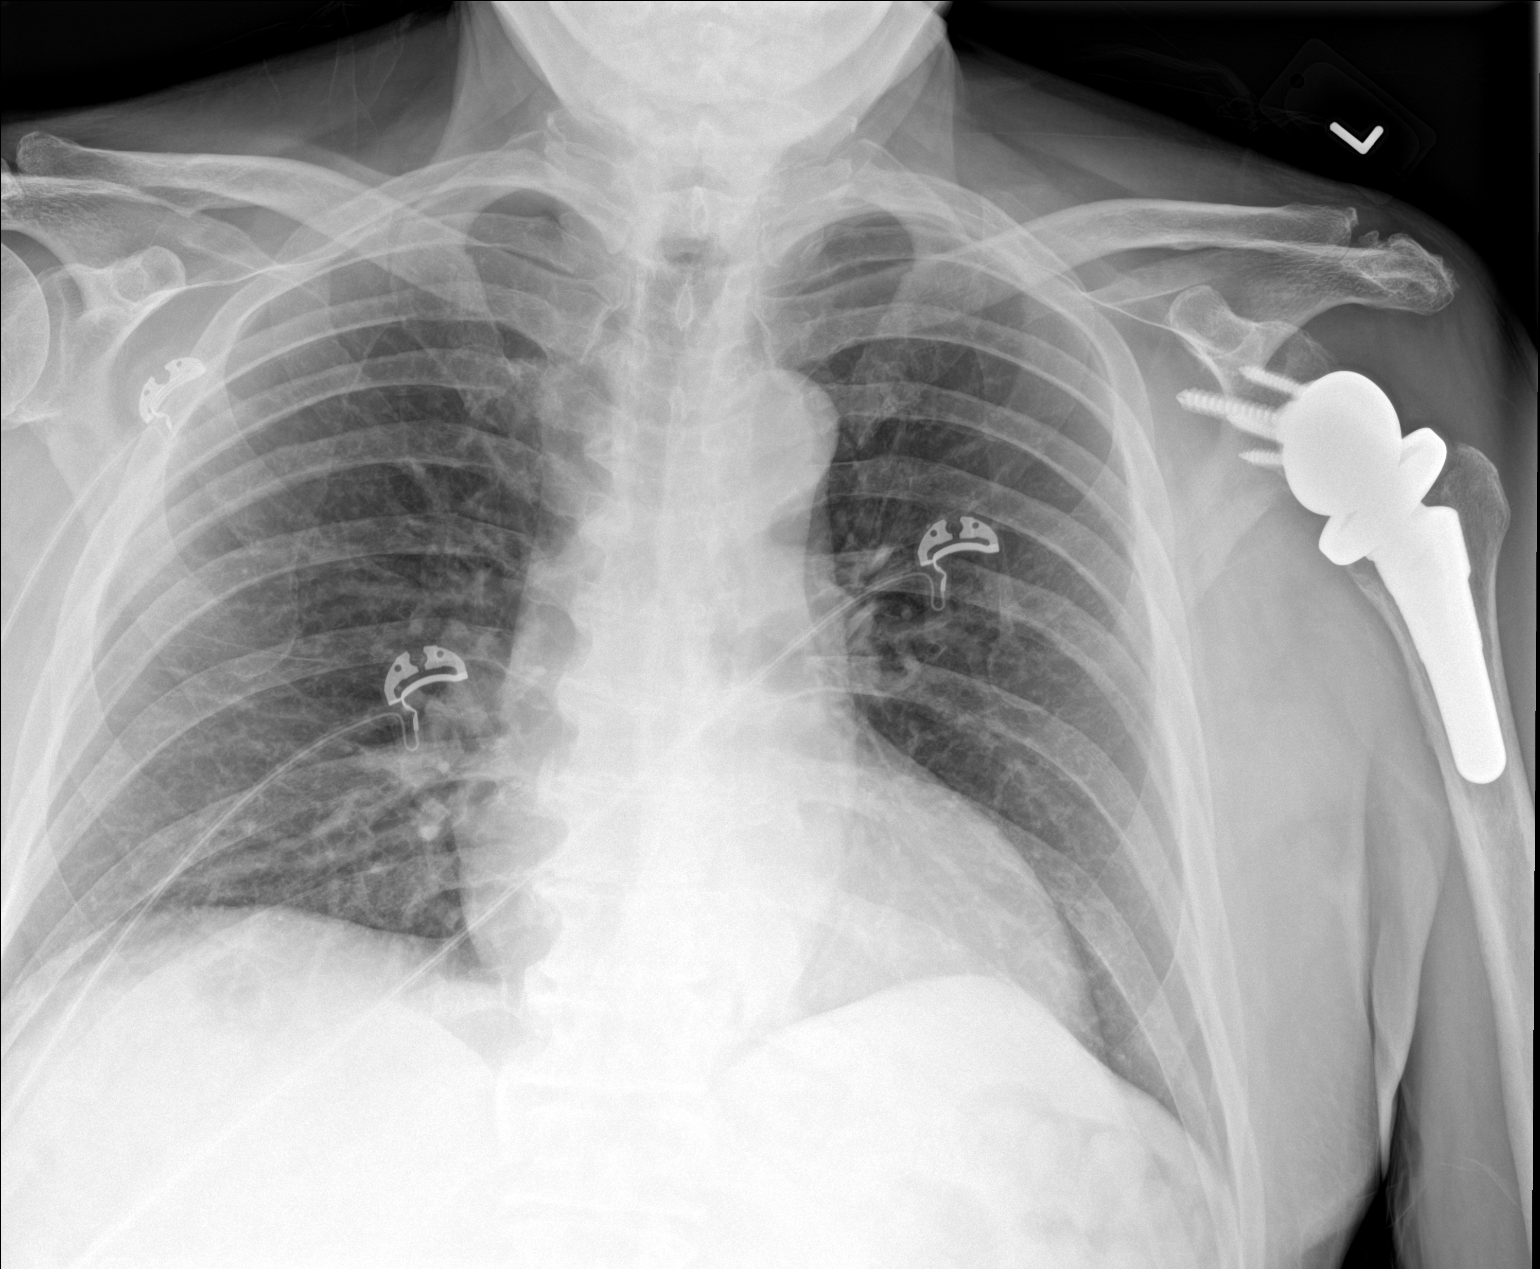

[chest ap (2 of 2)]
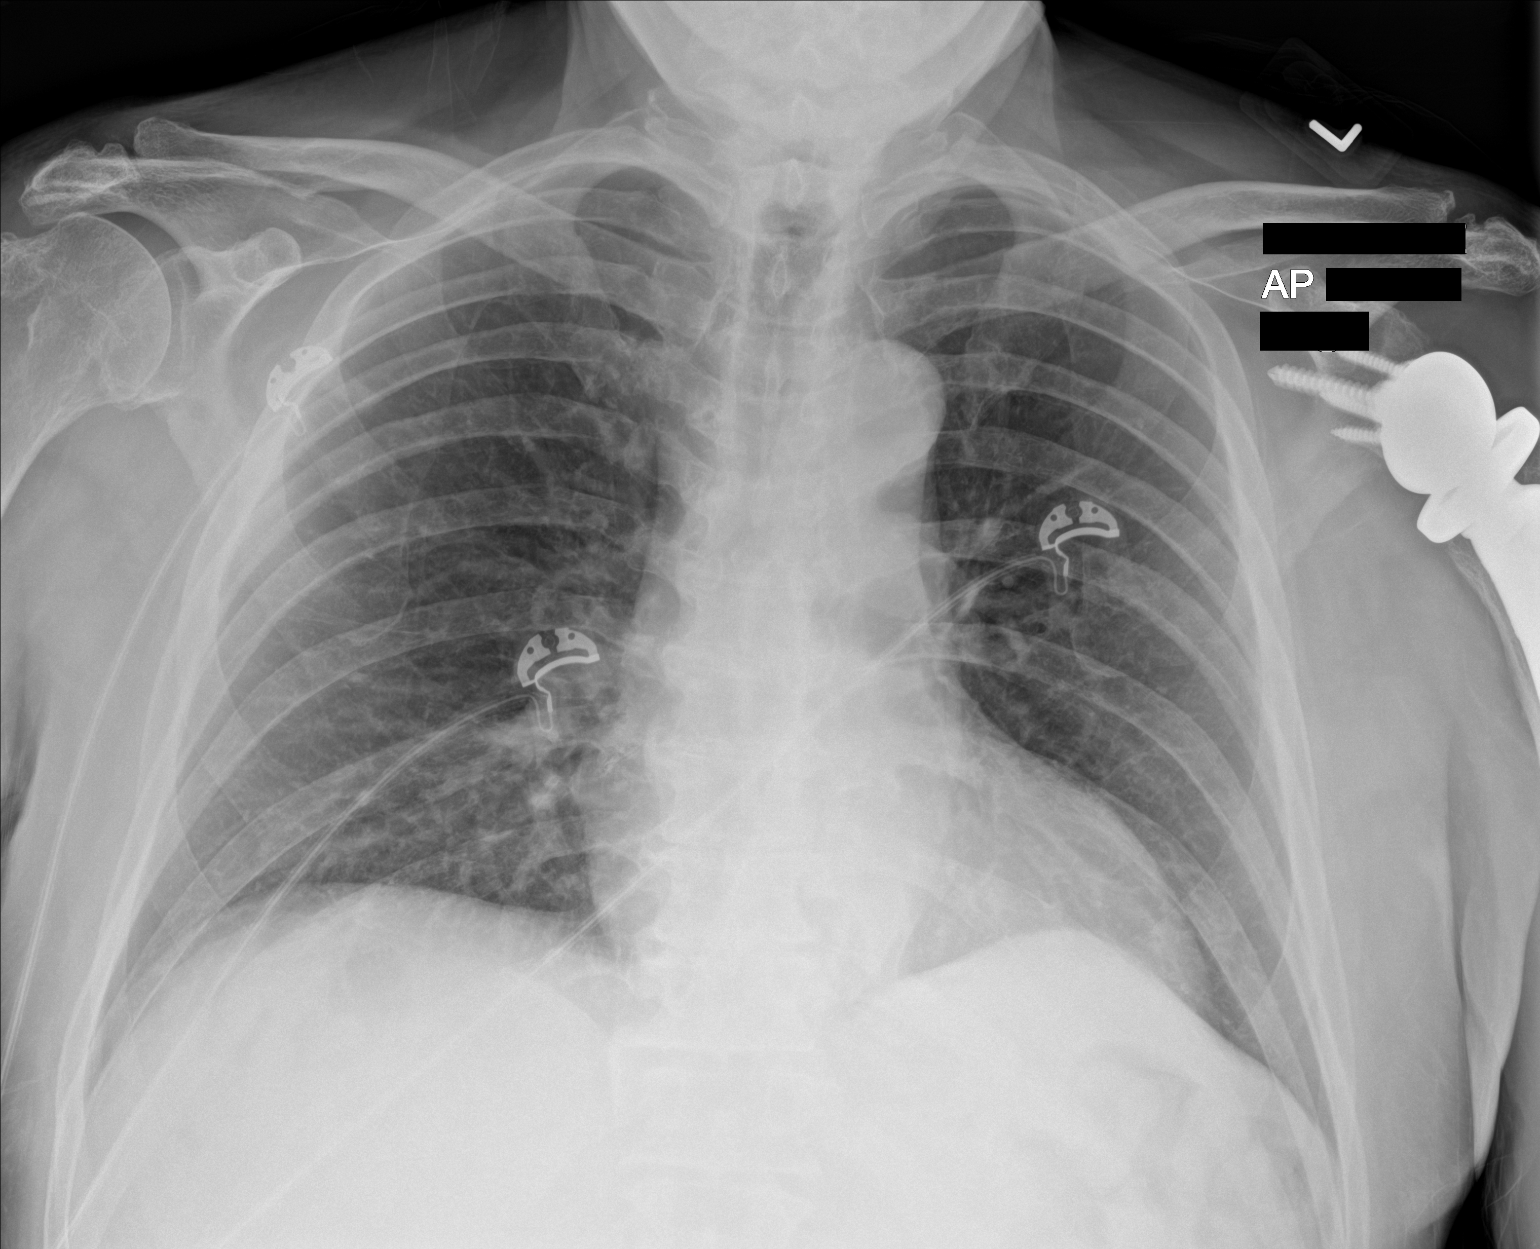

[2 of 2 positions shown; findings below may reference images not displayed]

FINDINGS: Cardiac shadows within normal limits. The lungs are well aerated
bilaterally. No focal infiltrate or sizable effusion is seen. Left
shoulder replacement is noted.
IMPRESSION: No acute abnormality noted.

## 2020-04-21 LAB — COLOGUARD
COLOGUARD: POSITIVE — AB
COLOGUARD: POSITIVE — AB

## 2022-04-06 ENCOUNTER — Encounter: Payer: Self-pay | Admitting: Urology

## 2022-04-06 ENCOUNTER — Ambulatory Visit (INDEPENDENT_AMBULATORY_CARE_PROVIDER_SITE_OTHER): Payer: Medicare HMO | Admitting: Urology

## 2022-04-06 VITALS — BP 124/71 | HR 106 | Wt 146.0 lb

## 2022-04-06 DIAGNOSIS — R3911 Hesitancy of micturition: Secondary | ICD-10-CM

## 2022-04-06 DIAGNOSIS — N401 Enlarged prostate with lower urinary tract symptoms: Secondary | ICD-10-CM | POA: Diagnosis not present

## 2022-04-06 DIAGNOSIS — N390 Urinary tract infection, site not specified: Secondary | ICD-10-CM | POA: Diagnosis not present

## 2022-04-06 LAB — MICROSCOPIC EXAMINATION
Bacteria, UA: NONE SEEN
Epithelial Cells (non renal): NONE SEEN /hpf (ref 0–10)

## 2022-04-06 LAB — URINALYSIS, COMPLETE
Bilirubin, UA: NEGATIVE
Leukocytes,UA: NEGATIVE
Nitrite, UA: NEGATIVE
Protein,UA: NEGATIVE
RBC, UA: NEGATIVE
Specific Gravity, UA: 1.015 (ref 1.005–1.030)
Urobilinogen, Ur: 0.2 mg/dL (ref 0.2–1.0)
pH, UA: 6.5 (ref 5.0–7.5)

## 2022-04-06 LAB — BLADDER SCAN AMB NON-IMAGING

## 2022-04-06 MED ORDER — TAMSULOSIN HCL 0.4 MG PO CAPS: 0.4000 mg | ORAL_CAPSULE | Freq: Two times a day (BID) | ORAL | 0 refills | Status: DC

## 2022-04-06 NOTE — Progress Notes (Signed)
04/06/2022 8:49 AM   Martin Watts 10-09-1945 [id number]  Referring provider: Dion Body, MD [place] [facility] [place],  [place] [address]  Chief Complaint  Patient presents with   New Patient (Initial Visit)   Benign Prostatic Hypertrophy    HPI: Martin Watts is a 76 y.o. male referred for evaluation of BPH.  Seen [place] clinic walk-in 02/18/2022 with a several day history of increasing frequency, urgency and dysuria Urinalysis with pyuria and was started on Cipro.  Urine culture grew staph epi.  He called back after 3-4 days without improvement in symptoms and was switched to Septra DS. Follow-up office visit 03/03/2022 with persistent symptoms.  Urinalysis still with mild pyuria and he was treated with a 14-day course of doxycycline with significant improvement in his voiding pattern. Was started on tamsulosin approximately 2 months ago for lower urinary tract symptoms with improvement.  He does note intermittent urinary stream and weak urinary stream.  IPSS today 17/35.  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 If you have any questions, ask your nurse or doctor.          aspirin EC 81 MG tablet Take  81 mg by mouth daily.   b complex vitamins capsule Take 1 capsule by mouth daily.   ezetimibe 10 MG tablet Commonly known as: ZETIA Take 10 mg by mouth daily.   glipiZIDE 10 MG tablet Commonly known as: GLUCOTROL Take 20 mg by mouth 2 (two) times daily.   metFORMIN 500 MG 24 hr tablet Commonly known as: GLUCOPHAGE-XR Take 1,000 mg by mouth 2 (two) times daily with a meal.   OVER THE COUNTER MEDICATION Take 1 tablet by mouth daily. Green Lipped Mussel   vitamin C 1000 MG tablet Take 1,000 mg by mouth daily.        Allergies:  Allergies  Allergen Reactions   Statins Other (See Comments)    Myalgia   Cefdinir Rash    Family History: No family history on file.  Social History:  reports that he has never smoked. He has never used smokeless tobacco. He reports that he does not drink alcohol and does not use drugs.   Physical Exam: BP 124/71   Pulse (!) 106   Wt 146 lb (66.2 kg)   BMI 22.20 kg/m   Constitutional:  Alert and oriented, No acute distress. HEENT: [place] AT Respiratory: Normal respiratory effort, no increased work of breathing. GU: 40 g, smooth without nodules Psychiatric: Normal mood and affect.  Laboratory Data:  Urinalysis Dipstick/microscopy negative   Assessment &  Plan:    1.  BPH with LUTS Symptoms have improved on tamsulosin though still with moderate LUTS PVR 0 mL Additional management options were discussed including titrating tamsulosin to 0.8 mg, changing to a prostate-specific alpha-blocker or adding a 5-ARI.  Surgical options were also discussed.  He has elected to titrate tamsulosin and Rx sent for 1 month trial of 0.8 mg.  He will call back regarding efficacy and will refill if doing better 22-monthfollow-up office visit  2.  Urinary tract infection Occasional mild dysuria UA today clear Repeat urine culture today   SAbbie Sons MD  [place] [place]  [phone]

## 2022-04-06 NOTE — Patient Instructions (Signed)
Rx tamsulosin 0.4 mg twice daily x1 month; call back if effective and will give refills.  70-monthfollow-up office visit and urine culture

## 2022-04-28 ENCOUNTER — Telehealth: Payer: Self-pay | Admitting: Urology

## 2022-04-28 NOTE — Telephone Encounter (Signed)
Pt called in stating the FLOMax .4 mg is working, pt would like a 90 day supply.  Pt goes through [employer] [facility]. [phone]

## 2022-04-29 ENCOUNTER — Other Ambulatory Visit: Payer: Self-pay | Admitting: Urology

## 2022-05-01 MED ORDER — TAMSULOSIN HCL 0.4 MG PO CAPS: 0.4000 mg | ORAL_CAPSULE | Freq: Two times a day (BID) | ORAL | 1 refills | Status: DC

## 2022-05-01 NOTE — Addendum Note (Signed)
Addended by: Kyra Manges on: 05/01/2022 08:22 AM   Modules accepted: Orders

## 2022-05-05 ENCOUNTER — Telehealth: Payer: Self-pay | Admitting: Urology

## 2022-05-05 ENCOUNTER — Other Ambulatory Visit: Payer: Self-pay | Admitting: Family Medicine

## 2022-05-05 MED ORDER — TAMSULOSIN HCL 0.4 MG PO CAPS: 0.4000 mg | ORAL_CAPSULE | Freq: Two times a day (BID) | ORAL | 1 refills | Status: DC

## 2022-05-05 NOTE — Telephone Encounter (Signed)
Pt was in office today and said [facility] didn't get his refill request.

## 2022-07-14 ENCOUNTER — Ambulatory Visit (INDEPENDENT_AMBULATORY_CARE_PROVIDER_SITE_OTHER): Payer: Medicare HMO | Admitting: Urology

## 2022-07-14 ENCOUNTER — Encounter: Payer: Self-pay | Admitting: Urology

## 2022-07-14 VITALS — BP 145/74 | HR 87 | Ht 68.0 in | Wt 143.0 lb

## 2022-07-14 DIAGNOSIS — N401 Enlarged prostate with lower urinary tract symptoms: Secondary | ICD-10-CM | POA: Diagnosis not present

## 2022-07-14 DIAGNOSIS — R3911 Hesitancy of micturition: Secondary | ICD-10-CM

## 2022-07-14 LAB — URINALYSIS, COMPLETE
Bilirubin, UA: NEGATIVE
Ketones, UA: NEGATIVE
Leukocytes,UA: NEGATIVE
Nitrite, UA: NEGATIVE
Protein,UA: NEGATIVE
RBC, UA: NEGATIVE
Specific Gravity, UA: 1.015 (ref 1.005–1.030)
Urobilinogen, Ur: 0.2 mg/dL (ref 0.2–1.0)
pH, UA: 7.5 (ref 5.0–7.5)

## 2022-07-14 LAB — MICROSCOPIC EXAMINATION: Bacteria, UA: NONE SEEN

## 2022-07-14 LAB — BLADDER SCAN AMB NON-IMAGING: Scan Result: 23

## 2022-07-14 MED ORDER — TAMSULOSIN HCL 0.4 MG PO CAPS: 0.4000 mg | ORAL_CAPSULE | Freq: Two times a day (BID) | ORAL | 2 refills | Status: DC

## 2022-07-14 NOTE — Progress Notes (Signed)
   07/14/2022 9:36 AM   Martin Watts 05-31-1946 [id number]  Referring provider: Dion Body, MD [facility] [place],  [place] [address]  Chief Complaint  Patient presents with   Benign Prostatic Hypertrophy    HPI: 77 y.o. male presents for a 3 month follow-up office visit  Initially seen 04/06/2022 for worsening lower urinary tract symptoms and UTI After UTI treatment was still having bothersome lower urinary tract symptoms.  IPSS 17/35 After discussing options he elected to titrate tamsulosin to 0.8 mg He has noted marked improvement in his voiding pattern with dose titration.  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 If you have any questions, ask your nurse or doctor.          aspirin EC 81 MG tablet Take 81 mg by mouth daily.   b complex vitamins capsule Take 1 capsule by mouth daily.   ezetimibe 10 MG tablet Commonly known as: ZETIA Take 10 mg by mouth daily.   glipiZIDE 10 MG tablet Commonly known as: GLUCOTROL Take 20 mg by mouth 2 (two) times daily.   metFORMIN 500 MG 24 hr tablet Commonly known as: GLUCOPHAGE-XR Take 1,000 mg by mouth 2 (two) times daily with a meal.   OVER THE COUNTER MEDICATION Take 1 tablet by mouth daily. Green Lipped  Mussel   tamsulosin 0.4 MG Caps capsule Commonly known as: FLOMAX Take 1 capsule (0.4 mg total) by mouth 2 (two) times daily.   vitamin C 1000 MG tablet Take 1,000 mg by mouth daily.        Allergies:  Allergies  Allergen Reactions   Statins Other (See Comments)    Myalgia   Cefdinir Rash    Family History: History reviewed. No pertinent family history.  Social History:  reports that he has never smoked. He has never used smokeless tobacco. He reports that he does not drink alcohol and does not use drugs.   Physical Exam: BP (!) 145/74   Pulse 87   Ht '5\' 8"'$  (1.727 m)   Wt 143 lb (64.9 kg)   BMI 21.74 kg/m   Constitutional:  Alert and oriented, No acute distress. HEENT: [place] AT Respiratory: Normal respiratory effort, no increased work of breathing. Psychiatric: Normal mood and affect.  Laboratory Data:  Urinalysis Dipstick/microscopy negative   Assessment & Plan:    1.  BPH with LUTS Mild LUTS with titration tamsulosin 0.8 mg Follow-up 1 year with PVR   Abbie Sons, MD  [place] [address], [place], [place] [address] [phone]

## 2023-03-06 ENCOUNTER — Other Ambulatory Visit: Payer: Self-pay | Admitting: Urology

## 2023-07-16 ENCOUNTER — Ambulatory Visit: Payer: Medicare HMO | Admitting: Urology

## 2023-07-27 ENCOUNTER — Encounter: Payer: Self-pay | Admitting: Urology

## 2023-07-27 ENCOUNTER — Ambulatory Visit (INDEPENDENT_AMBULATORY_CARE_PROVIDER_SITE_OTHER): Payer: Medicare HMO | Admitting: Urology

## 2023-07-27 VITALS — BP 132/71 | HR 97 | Ht 68.0 in | Wt 145.0 lb

## 2023-07-27 DIAGNOSIS — R3911 Hesitancy of micturition: Secondary | ICD-10-CM | POA: Diagnosis not present

## 2023-07-27 DIAGNOSIS — N401 Enlarged prostate with lower urinary tract symptoms: Secondary | ICD-10-CM

## 2023-07-27 LAB — BLADDER SCAN AMB NON-IMAGING: Scan Result: 14

## 2023-07-27 NOTE — Progress Notes (Signed)
I, Martin Watts, acting as a scribe for Martin Altes, MD., have documented all relevant documentation on the behalf of Martin Altes, MD, as directed by Martin Altes, MD while in the presence of Martin Altes, MD.  07/27/2023 10:52 AM   Rebecca Eaton 1946-02-03 [id number]  Referring provider: Marisue Ivan, MD [phone] [facility] [address] [facility] [place],  [place] [address]  Chief Complaint  Patient presents with   Benign Prostatic Hypertrophy   Urologic history: 1. BPH with LUTS Initially seen October 2023 for UTI and worsening LUTS Persistent moderate symptoms after UTI treatment.  Tamsulosin titrated 0.8 mg with marked improvement and mild lower urinary tract symptoms (IPSS 5/35).   HPI: Martin Watts is a 78 y.o. male presents for annual follow-up.  No problem since last year's visit.  Remains on tamsulosin and has no bothersome lower urinary tract symptoms.  Denies dysuria, gross hematuria.  Denies flank, abdominal, or pelvic pain.    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 If you have any questions, ask your nurse or doctor.          aspirin EC 81 MG tablet Take 81 mg by mouth daily.   b complex vitamins capsule Take 1 capsule by mouth daily.   CRANBERRY EXTRACT PO Take by  mouth.   ezetimibe 10 MG tablet Commonly known as: ZETIA Take 10 mg by mouth daily.   glipiZIDE 10 MG tablet Commonly known as: GLUCOTROL Take by mouth.  TAKE 2 TABLETS BY MOUTH TWICE A DAY BEFORE MEALS What changed: Another medication with the same name was removed. Continue taking this medication, and follow the directions you see here. Changed by: Martin Watts   Invokana 100 MG Tabs tablet Generic drug: canagliflozin Take 100 mg by mouth daily before breakfast.   metFORMIN 500 MG 24 hr tablet Commonly known as: GLUCOPHAGE-XR Take 1,000 mg by mouth 2 (two) times daily with a meal.   OVER THE COUNTER MEDICATION Take 1 tablet by mouth daily. Green Lipped Mussel   tamsulosin 0.4 MG Caps capsule Commonly known as: FLOMAX TAKE 1 CAPSULE BY MOUTH TWICE  DAILY   vitamin C 1000 MG tablet Take 1,000 mg by mouth daily.   ZINC PO Take by mouth. ZINC        Allergies:  Allergies  Allergen Reactions   Statins Other (See Comments)    Myalgia   Cefdinir Rash    Social History:  reports that he has never smoked. He has never used smokeless tobacco. He reports that he does not drink alcohol and does not use drugs.   Physical Exam: BP 132/71  Pulse 97   Ht 5\' 8"  (1.727 m)   Wt 145 lb (65.8 kg)   BMI 22.05 kg/m   Constitutional:  Alert and oriented, No acute distress. HEENT: [place] AT Respiratory: Normal respiratory effort, no increased work of breathing. Psychiatric: Normal mood and affect.  Assessment & Plan:    1. BPH with LUTS Doing well on tamsulosin- he did not need refills at this time.  PVR today 14 mL He desires to continue annual follow-up.   I have reviewed the above documentation for accuracy and completeness, and I agree with the above.   Martin Altes, MD  [facility] [address] [place], [place] [address] [phone]

## 2023-11-20 ENCOUNTER — Other Ambulatory Visit: Payer: Self-pay | Admitting: Internal Medicine

## 2023-11-20 DIAGNOSIS — I2089 Other forms of angina pectoris: Secondary | ICD-10-CM

## 2023-11-20 DIAGNOSIS — R0602 Shortness of breath: Secondary | ICD-10-CM

## 2023-11-21 ENCOUNTER — Other Ambulatory Visit: Payer: Self-pay | Admitting: Internal Medicine

## 2023-11-21 DIAGNOSIS — I2089 Other forms of angina pectoris: Secondary | ICD-10-CM

## 2023-11-21 DIAGNOSIS — R0602 Shortness of breath: Secondary | ICD-10-CM

## 2023-11-26 ENCOUNTER — Ambulatory Visit
Admission: RE | Admit: 2023-11-26 | Discharge: 2023-11-26 | Disposition: A | Discharge status: Home / Self Care | Payer: Self-pay | Source: Ambulatory Visit | Attending: Internal Medicine | Admitting: Internal Medicine

## 2023-11-26 DIAGNOSIS — R0602 Shortness of breath: Secondary | ICD-10-CM | POA: Insufficient documentation

## 2023-11-26 DIAGNOSIS — I2089 Other forms of angina pectoris: Secondary | ICD-10-CM | POA: Insufficient documentation

## 2023-12-07 ENCOUNTER — Other Ambulatory Visit: Payer: Self-pay | Admitting: Internal Medicine

## 2023-12-07 DIAGNOSIS — R931 Abnormal findings on diagnostic imaging of heart and coronary circulation: Secondary | ICD-10-CM

## 2023-12-11 ENCOUNTER — Ambulatory Visit
Admission: RE | Admit: 2023-12-11 | Discharge: 2023-12-11 | Disposition: A | Discharge status: Home / Self Care | Source: Ambulatory Visit | Attending: Internal Medicine | Admitting: Internal Medicine

## 2023-12-11 DIAGNOSIS — I2089 Other forms of angina pectoris: Secondary | ICD-10-CM | POA: Diagnosis present

## 2023-12-11 DIAGNOSIS — R0602 Shortness of breath: Secondary | ICD-10-CM | POA: Diagnosis present

## 2023-12-11 MED ORDER — REGADENOSON 0.4 MG/5ML IV SOLN
0.4000 mg | Freq: Once | INTRAVENOUS | Status: AC
  Administered 2023-12-11: 0.4 mg via INTRAVENOUS
  Filled 2023-12-11: qty 5

## 2023-12-11 MED ORDER — TECHNETIUM TC 99M TETROFOSMIN IV KIT
31.1900 | PACK | Freq: Once | INTRAVENOUS | Status: AC | PRN
  Administered 2023-12-11: 31.19 via INTRAVENOUS

## 2023-12-11 MED ORDER — TECHNETIUM TC 99M TETROFOSMIN IV KIT
10.8800 | PACK | Freq: Once | INTRAVENOUS | Status: AC | PRN
  Administered 2023-12-11: 10.88 via INTRAVENOUS

## 2023-12-12 LAB — NM MYOCAR MULTI W/SPECT W/WALL MOTION / EF
Estimated workload: 1
Exercise duration (min): 1 min
Exercise duration (sec): 0 s
MPHR: 143 {beats}/min
Nuc Stress EF: 60 %
Peak HR: 103 {beats}/min
Percent HR: 72 %
Rest HR: 66 {beats}/min
Rest Nuclear Isotope Dose: 10.9 mCi
SDS: 0
SRS: 1
SSS: 0
ST Depression (mm): 0 mm
Stress Nuclear Isotope Dose: 31.2 mCi
TID: 0.98

## 2023-12-26 ENCOUNTER — Telehealth (HOSPITAL_COMMUNITY): Payer: Self-pay | Admitting: Emergency Medicine

## 2023-12-26 ENCOUNTER — Telehealth (HOSPITAL_COMMUNITY): Payer: Self-pay | Admitting: *Deleted

## 2023-12-26 MED ORDER — METOPROLOL TARTRATE 100 MG PO TABS: 100.0000 mg | ORAL_TABLET | Freq: Once | ORAL | 0 refills | Status: DC

## 2023-12-26 NOTE — Telephone Encounter (Signed)
 Patient returning call about his upcoming cardiac imaging study; pt verbalizes understanding of appt date/time, parking situation and where to check in, pre-test NPO status and medications ordered, and verified current allergies; name and call back number provided for further questions should they arise  Larey Brick RN Navigator Cardiac Imaging [facility] [phone] office [phone] cell  Patient to take 100mg  metoprolol tartrate two hours prior to his cardiac CT scan.

## 2023-12-26 NOTE — Telephone Encounter (Signed)
 Attempted to call patient regarding upcoming cardiac CT appointment. Left message on voicemail with name and callback number Rockwell Alexandria RN Navigator Cardiac Imaging [facility] [phone] Office [phone] Cell

## 2023-12-27 ENCOUNTER — Ambulatory Visit
Admission: RE | Admit: 2023-12-27 | Discharge: 2023-12-27 | Disposition: A | Discharge status: Home / Self Care | Source: Ambulatory Visit | Attending: Internal Medicine | Admitting: Internal Medicine

## 2023-12-27 DIAGNOSIS — I2510 Atherosclerotic heart disease of native coronary artery without angina pectoris: Secondary | ICD-10-CM | POA: Diagnosis not present

## 2023-12-27 DIAGNOSIS — R931 Abnormal findings on diagnostic imaging of heart and coronary circulation: Secondary | ICD-10-CM | POA: Diagnosis present

## 2023-12-27 LAB — POCT I-STAT CREATININE: Creatinine, Ser: 0.9 mg/dL (ref 0.61–1.24)

## 2023-12-27 MED ORDER — IOHEXOL 350 MG/ML SOLN
80.0000 mL | Freq: Once | INTRAVENOUS | Status: AC | PRN
  Administered 2023-12-27: 80 mL via INTRAVENOUS

## 2023-12-27 MED ORDER — NITROGLYCERIN 0.4 MG SL SUBL
0.8000 mg | SUBLINGUAL_TABLET | Freq: Once | SUBLINGUAL | Status: AC
  Administered 2023-12-27: 0.8 mg via SUBLINGUAL
  Filled 2023-12-27: qty 25

## 2023-12-27 NOTE — Progress Notes (Signed)
 Patient tolerated CT well. Vital signs stable encourage to drink water throughout day.Reasons explained and verbalized understanding. Ambulated steady gait.

## 2024-01-28 ENCOUNTER — Encounter: Payer: Self-pay | Admitting: Urology

## 2024-04-16 ENCOUNTER — Other Ambulatory Visit: Payer: Self-pay | Admitting: Surgery

## 2024-04-16 DIAGNOSIS — S83231S Complex tear of medial meniscus, current injury, right knee, sequela: Secondary | ICD-10-CM

## 2024-04-16 DIAGNOSIS — M1711 Unilateral primary osteoarthritis, right knee: Secondary | ICD-10-CM

## 2024-04-16 DIAGNOSIS — M25461 Effusion, right knee: Secondary | ICD-10-CM

## 2024-04-20 ENCOUNTER — Other Ambulatory Visit: Payer: Self-pay | Admitting: Urology

## 2024-04-21 ENCOUNTER — Ambulatory Visit
Admission: RE | Admit: 2024-04-21 | Discharge: 2024-04-21 | Disposition: A | Discharge status: Home / Self Care | Source: Ambulatory Visit | Attending: Surgery | Admitting: Surgery

## 2024-04-21 DIAGNOSIS — S83231S Complex tear of medial meniscus, current injury, right knee, sequela: Secondary | ICD-10-CM | POA: Insufficient documentation

## 2024-04-21 DIAGNOSIS — M25461 Effusion, right knee: Secondary | ICD-10-CM | POA: Insufficient documentation

## 2024-04-21 DIAGNOSIS — M1711 Unilateral primary osteoarthritis, right knee: Secondary | ICD-10-CM | POA: Insufficient documentation

## 2024-05-21 NOTE — Progress Notes (Signed)
 Established Patient Visit   Chief Complaint: Chief Complaint  Patient presents with   Follow-up    6 month follow up   Date of Service: 05/29/2024 Date of Birth: 1946/02/13 PCP: Alla Amis, MD  History of Present Illness: Mr. Martin Watts is a 78 y.o.male patient who presents for a 6 month follow up. PMH significant for SOB, angina, type 2 diabetes, diabetic polyneuropathy, hypercholesterolemia.  Today, pt presents with no new cardiac concerns. Patient states that he is doing well. He is planning on eventually having right knee surgery. Denies having any recent chest pain, SOB. Encouraged to regularly exercise. Start repatha.   Visit Summaries: 12/18/2023 Patient was seen by me for a follow up and presented without any cardiac complaints. No changes were made  Past Medical and Surgical History  Past Medical History Past Medical History:  Diagnosis Date   Allergy 11/2003   Statins   Arthritis 09/1998   Pure hypercholesterolemia  - intolerant to statins    Type 2 diabetes mellitus with hyperlipidemia     Past Surgical History He has a past surgical history that includes acl replacement (Left); Back surgery; Endoscopic Carpal Tunnel Release; Repair Subscapularis Muscle; Right Ring Trigger Finger Release 05/03/18 (Right, 05/03/2018); Reverse left total shoulder arthroplasty (Left, 12/03/2018); Colonoscopy (05/07/2020); and Cataract extraction (Right, 05/31/2021).   Medications and Allergies  Current Medications  Current Outpatient Medications  Medication Sig Dispense Refill   ACCU-CHEK GUIDE TEST STRIPS test strip USE AS INSTRUCTED. USE TO CHECK BLOOD SUGAR TWICE DAILY ACCU-CHEK GUIDE DX E11.69 200 strip 1   acetaminophen  (TYLENOL ) 500 MG tablet Take 1,000 mg by mouth 2 (two) times daily     ascorbic acid , vitamin C , (VITAMIN C ) 1000 MG tablet Take by mouth Take 1,000 mg by mouth daily.     aspirin  81 MG EC tablet Take 1 tablet (81 mg total) by mouth once daily 30 tablet 11    b complex vitamins capsule Take 1 capsule by mouth once daily.     blood glucose meter kit USE TO CHECK BLOOD SUGAR TWICE DAILY ACCU-CHEK DX E11.69 1 each 0   blood-glucose sensor (DEXCOM G7 SENSOR) Devi Use 1 each every 10 (ten) days Use sensor for glucose monitoring 9 each 3   blood-glucose,receiver,cont (DEXCOM G7 RECEIVER) Misc Use 1 each as directed Use receiver for glucose monitoring 1 each 1   cholecalciferol, vitamin D3, (VITAMIN D3) 125 mcg (5,000 unit) tablet Take 5,000 Units by mouth once daily     DIETARY SUPPLEMENT ORAL Take by mouth once daily Green Lipped Muscle Extract     docosahexaenoic acid/epa (FISH OIL ORAL) Take by mouth once daily     ezetimibe  (ZETIA ) 10 mg tablet TAKE 1 TABLET BY MOUTH ONCE  DAILY 90 tablet 3   glipiZIDE  (GLUCOTROL ) 10 MG tablet TAKE 2 TABLETS BY MOUTH TWICE A  DAY BEFORE MEALS 360 tablet 3   ibuprofen (MOTRIN) 200 MG tablet Take 400 mg by mouth as needed for Pain     insulin  GLARGINE (LANTUS  SOLOSTAR U-100 INSULIN ) pen injector (concentration 100 units/mL) Inject 10 Units subcutaneously at bedtime 15 mL 3   lancets (ACCU-CHEK FASTCLIX LANCET DRUM) CHECK BLOOD SUGARS TWICE DAILY. ACCU-CHEK DX E11.69 204 each 1   lisinopriL (ZESTRIL) 2.5 MG tablet TAKE 1 TABLET BY MOUTH DAILY 90 tablet 3   magnesium  oxide (MAG-OX) 400 mg (241.3 mg magnesium ) tablet Take 400 mg by mouth once daily     metFORMIN  (GLUCOPHAGE -XR) 500 MG XR tablet TAKE 2 TABLETS TWICE  A DAY WITH  MEALS 360 tablet 3   multivitamin tablet Take 1 tablet by mouth once daily.     pen needle, diabetic (PEN NEEDLE) 31 gauge x 5/16 needle Use as directed (Use once daily with insulin . Dx E11.69) 100 each 1   tadalafiL (CIALIS) 10 MG tablet Take dose 30-45 min prior to anticipated sexual activity. 10 tablet 6   tamsulosin  (FLOMAX ) 0.4 mg capsule TAKE 1 CAPSULE (0.4 MG TOTAL) BY MOUTH ONCE DAILY, 30 MINUTES AFTER SAME MEAL EACH DAY. 90 capsule 1   TURMERIC ORAL Take by mouth once  daily     XIIDRA 5 % ophthalmic solution Place 1 drop into both eyes 2 (two) times daily     ZINC ORAL Take by mouth One daily     canagliflozin (INVOKANA) 300 mg tablet TAKE 1 TABLET BY MOUTH EVERY  MORNING BEFORE BREAKFAST 90 tablet 1   cycloSPORINE (RESTASIS) 0.05 % ophthalmic emulsion Place 1 drop into both eyes 2 (two) times daily (Patient not taking: Reported on 05/29/2024)     REPATHA SURECLICK 140 mg/mL PnIj Inject 140 mg subcutaneously every 14 (fourteen) days 6 mL 3   No current facility-administered medications for this visit.    Allergies: Cefdinir and Statins-hmg-coa reductase inhibitors  Social and Family History  Social History  reports that he has never smoked. He has never been exposed to tobacco smoke. He has never used smokeless tobacco. He reports that he does not drink alcohol  and does not use drugs.  Family History Family History  Problem Relation Name Age of Onset   Stroke Mother Glinner    Ataxia Mother Glinner    No Known Problems Father     Ataxia Sister     Huntington's disease Sister     Ataxia Brother      Review of Systems   Pertinent positives and negatives are mentioned above in HPI and all other systems are negative.  Physical Examination   Vitals:BP 118/82   Pulse 55   Ht 172.7 cm (5' 8)   Wt 66 kg (145 lb 9.6 oz)   SpO2 98%   BMI 22.14 kg/m  Ht:172.7 cm (5' 8) Wt:66 kg (145 lb 9.6 oz) ADJ:Anib surface area is 1.78 meters squared. Body mass index is 22.14 kg/m.  HEENT: Pupils equally reactive to light and accomodation  Neck: Supple without thyromegaly, carotid pulses 2+ Lungs: clear to auscultation bilaterally; no wheezes, rales, rhonchi Heart: Regular rate and rhythm.  No gallops, murmurs or rub Abdomen: soft nontender, nondistended, with normal bowel sounds Extremities: no cyanosis, clubbing, or edema Peripheral Pulses: 2+ in all extremities, 2+ femoral pulses bilaterally Neurologic: Alert and oriented X3; speech  intact; face symmetrical; moves all extremities well  Cardiovascular Studies:    Echocardiogram 2D complete: 12/05/2023 CONCLUSION ------------------------------------------------------------------------------- NORMAL LEFT VENTRICULAR SYSTOLIC FUNCTION WITH MILD LVH ESTIMATED EF: 55%, CALC EF(2D): 55% NORMAL LA PRESSURES WITH DIASTOLIC DYSFUNCTION (GRADE 1) NORMAL RIGHT VENTRICULAR SYSTOLIC FUNCTION VALVULAR REGURGITATION: No AR, MILD MR, No PR, MILD TR ESTIMATED RVSP: 29 mmHg NO VALVULAR STENOSIS ------------------------------------------------------------------------------------------ No Prior Echo Study MILD MR, TR EF 50-55%   NM Myocardial Perfusion SPECT multiple (stress and rest): 12/11/2023 Narrative This result has an attachment that is not available.   Findings are consistent with no ischemia. The study is low risk.   No ST deviation was noted.   LV perfusion is normal. There is no evidence of ischemia. There is no evidence of infarction.   Left ventricular function is normal. Nuclear  stress EF: 60%. The left ventricular ejection fraction is normal (55-65%). End diastolic cavity size is normal.  Conclusion Normal myocardial perfusion scan No evidence of stress-induced myocardial ischemia Ejection fraction of 60% This is a low restudy  Cardiac Catheterization:    Holter:   Cardiac CT Scan: 12/27/2023 IMPRESSION:  1. Coronary calcium score of 1307. This was 80th percentile for age  and sex matched control.   2. Normal coronary origin with left dominance.   3. Mild stenosis (25-49%) noted in the LM, LAD, LCx, RCA.   4. CAD-RADS 2. Mild non-obstructive CAD (25-49%). Consider  non-atherosclerotic causes of chest pain. Consider preventive  therapy and risk factor modification.   11/26/2023 CONCLUSION ------------------------------------------------------------------------------- NORMAL LEFT VENTRICULAR SYSTOLIC FUNCTION WITH MILD LVH ESTIMATED EF: 55%,  CALC EF(2D): 55% NORMAL LA PRESSURES WITH DIASTOLIC DYSFUNCTION (GRADE 1) NORMAL RIGHT VENTRICULAR SYSTOLIC FUNCTION VALVULAR REGURGITATION: No AR, MILD MR, No PR, MILD TR ESTIMATED RVSP: 29 mmHg NO VALVULAR STENOSIS ------------------------------------------------------------------------------------------ No Prior Echo Study MILD MR, TR EF 50-55%   Cardiac MRI:   Assessment   78 y.o. male with  1. SOB (shortness of breath) on exertion   2. Essential hypertension   3. Pure hypercholesterolemia (LDL 106 - 06/28/23) - intolerant to statins; currently on zetia    4. Type 2 diabetes mellitus with hyperlipidemia (A1c 7.4% - 06/28/23)   5. Diabetic polyneuropathy associated with type 2 diabetes mellitus (CMS/HHS-HCC)    Plan   SOB, continue inhalers as needed, follow up with pulmonary as needed Hypertension, today's BP was 118/82, reasonably controlled, continue lisinopril Hypercholesterolemia, continue zetia  for lipid management, repatha  Diabetes, chronic, stable, continue canagliflozin, glipizide , metformin      Return in about 1 year (around 05/29/2025).  This note is partially written by Leita Ellen, in the presence of and acting as the scribe of Dr. Cara Lovelace.      Leita Ellen  I have reviewed, edited and added to the note to reflect my best personal medical judgment.  Attestation Statement:   I personally performed the service. (TP)  DWAYNE JONETTA LOVELACE, MD  [facility] Card[facility] [place], [place] Ph:  [phone] Fax:  [phone] This note was generated in part with voice recognition software, Dragon.  I apologize for any typographical errors that were not detected and corrected from this process.  They are unintentional.

## 2024-05-29 ENCOUNTER — Other Ambulatory Visit: Payer: Self-pay | Admitting: Surgery

## 2024-06-11 ENCOUNTER — Inpatient Hospital Stay: Admission: RE | Admit: 2024-06-11 | Discharge: 2024-06-11 | Attending: Surgery

## 2024-06-11 ENCOUNTER — Other Ambulatory Visit: Payer: Self-pay

## 2024-06-11 VITALS — BP 146/78 | HR 83 | Resp 16 | Ht 68.0 in | Wt 144.0 lb

## 2024-06-11 DIAGNOSIS — Z01812 Encounter for preprocedural laboratory examination: Secondary | ICD-10-CM

## 2024-06-11 DIAGNOSIS — Z01818 Encounter for other preprocedural examination: Secondary | ICD-10-CM | POA: Diagnosis present

## 2024-06-11 DIAGNOSIS — I498 Other specified cardiac arrhythmias: Secondary | ICD-10-CM | POA: Diagnosis not present

## 2024-06-11 DIAGNOSIS — E119 Type 2 diabetes mellitus without complications: Secondary | ICD-10-CM | POA: Diagnosis not present

## 2024-06-11 DIAGNOSIS — M1711 Unilateral primary osteoarthritis, right knee: Secondary | ICD-10-CM

## 2024-06-11 HISTORY — DX: Sepsis, unspecified organism: A41.9

## 2024-06-11 LAB — COMPREHENSIVE METABOLIC PANEL WITH GFR
ALT: 17 U/L (ref 0–44)
AST: 19 U/L (ref 15–41)
Albumin: 4.6 g/dL (ref 3.5–5.0)
Alkaline Phosphatase: 68 U/L (ref 38–126)
Anion gap: 14 (ref 5–15)
BUN: 20 mg/dL (ref 8–23)
CO2: 27 mmol/L (ref 22–32)
Calcium: 9.8 mg/dL (ref 8.9–10.3)
Chloride: 95 mmol/L — ABNORMAL LOW (ref 98–111)
Creatinine, Ser: 0.72 mg/dL (ref 0.61–1.24)
GFR, Estimated: >60 mL/min (ref >60)
Glucose, Bld: 144 mg/dL — ABNORMAL HIGH (ref 70–99)
Potassium: 3.9 mmol/L (ref 3.5–5.1)
Sodium: 135 mmol/L (ref 135–145)
Total Bilirubin: 0.3 mg/dL (ref 0.0–1.2)
Total Protein: 7.2 g/dL (ref 6.5–8.1)

## 2024-06-11 LAB — CBC WITH DIFFERENTIAL/PLATELET
Abs Immature Granulocytes: 0.13 K/uL — ABNORMAL HIGH (ref 0.00–0.07)
Basophils Absolute: 0.1 K/uL (ref 0.0–0.1)
Basophils Relative: 1 %
Eosinophils Absolute: 0 K/uL (ref 0.0–0.5)
Eosinophils Relative: 0 %
HCT: 43.1 % (ref 39.0–52.0)
Hemoglobin: 14.4 g/dL (ref 13.0–17.0)
Immature Granulocytes: 1 %
Lymphocytes Relative: 19 %
Lymphs Abs: 1.9 K/uL (ref 0.7–4.0)
MCH: 30.4 pg (ref 26.0–34.0)
MCHC: 33.4 g/dL (ref 30.0–36.0)
MCV: 90.9 fL (ref 80.0–100.0)
Monocytes Absolute: 0.8 K/uL (ref 0.1–1.0)
Monocytes Relative: 8 %
Neutro Abs: 7.5 K/uL (ref 1.7–7.7)
Neutrophils Relative %: 71 %
Platelets: 432 K/uL — ABNORMAL HIGH (ref 150–400)
RBC: 4.74 MIL/uL (ref 4.22–5.81)
RDW: 13.3 % (ref 11.5–15.5)
WBC: 10.5 K/uL (ref 4.0–10.5)
nRBC: 0 % (ref 0.0–0.2)

## 2024-06-11 LAB — URINALYSIS, ROUTINE W REFLEX MICROSCOPIC
Bacteria, UA: NONE SEEN
Bilirubin Urine: NEGATIVE
Glucose, UA: >=500 mg/dL — AB
Hgb urine dipstick: NEGATIVE
Ketones, ur: NEGATIVE mg/dL
Leukocytes,Ua: NEGATIVE
Nitrite: NEGATIVE
Protein, ur: NEGATIVE mg/dL
Specific Gravity, Urine: 1.027 (ref 1.005–1.030)
Squamous Epithelial / HPF: 0 /HPF (ref 0–5)
WBC, UA: 0 WBC/hpf (ref 0–5)
pH: 6 (ref 5.0–8.0)

## 2024-06-11 LAB — SURGICAL PCR SCREEN
MRSA, PCR: NEGATIVE
Staphylococcus aureus: NEGATIVE

## 2024-06-11 LAB — HEMOGLOBIN A1C
Hgb A1c MFr Bld: 7.8 % — ABNORMAL HIGH (ref 4.8–5.6)
Mean Plasma Glucose: 177.16 mg/dL

## 2024-06-11 NOTE — Patient Instructions (Addendum)
 Your procedure is scheduled on: Tuesday 06/24/24 To find out your arrival time, please call [phone] between 1PM - 3PM on: Monday 06/23/24 Report to the Registration Desk on the 1st floor of the [facility]. If your arrival time is 6:00 am, do not arrive before that time as the [facility] entrance doors do not open until 6:00 am.  REMEMBER: Instructions that are not followed completely may result in serious medical risk, up to and including death; or upon the discretion of your surgeon and anesthesiologist your surgery may need to be rescheduled.  Do not eat food after midnight the night before surgery.  No gum chewing or hard candies.  You may however, drink CLEAR liquids up to 2 hours before you are scheduled to arrive for your surgery. Do not drink anything within 2 hours of your scheduled arrival time.  Clear liquids include: - water   **Type 1 and Type 2 diabetics should only drink water.**  In addition, your doctor has ordered for you to drink the provided:  Gatorade G2 Drinking this carbohydrate drink up to two hours before surgery helps to reduce insulin  resistance and improve patient outcomes. Please complete drinking 2 hours before scheduled arrival time.  One week prior to surgery: Stop Anti-inflammatories (NSAIDS) such as Advil, Aleve, Ibuprofen, Motrin, Naproxen, Naprosyn and Aspirin  based products such as Excedrin, Goody's Powder, BC Powder for at least 7 days.  You may however, continue to take Tylenol  if needed for pain up until the day of surgery.  Stop ANY OVER THE COUNTER supplements and vitamins for at least 7 days until after surgery.  **Follow guidelines for insulin  and diabetes medications.** Hold Invokana for 3 days. Last dose Friday 06/20/24. Hold Metformin  for 2 days. Last dose Saturday 06/21/24. Decrease bedtime insulin  to half of your regular dose the night before surgery (Monday 06/23/24). (Continue your glipizide  as usual)  **Follow  recommendations regarding stopping blood thinners.** Ask ortho about stopping your Aspirin  .  Continue taking all of your other prescription medications up until the day of surgery.  ON THE DAY OF SURGERY ONLY TAKE THESE MEDICATIONS WITH SIPS OF WATER:  ezetimibe  (ZETIA ) 10 MG tablet   No Alcohol  for 24 hours before or after surgery.  No Smoking including e-cigarettes for 24 hours before surgery.  No chewable tobacco products for at least 6 hours before surgery.  No nicotine patches on the day of surgery.  Do not use any recreational drugs for at least a week (preferably 2 weeks) before your surgery.  Please be advised that the combination of cocaine and anesthesia may have negative outcomes, up to and including death. If you test positive for cocaine, your surgery will be cancelled.  On the morning of surgery brush your teeth with toothpaste and water, you may rinse your mouth with mouthwash if you wish. Do not swallow any toothpaste or mouthwash.  Use CHG Soap or wipes as directed on instruction sheet. (You can pick this up at our office in the [facility], the building to the left of the [employer], [address] at [address]  Do not shave body hair from the neck down 48 hours before surgery.  Do not wear lotions, powders, or perfumes.   Wear comfortable clothing (specific to your surgery type) to the hospital.  Do not wear jewelry, make-up, hairpins, clips or nail polish.  For welded (permanent) jewelry: bracelets, anklets, waist bands, etc.  Please have this removed prior to surgery.  If it is not removed, there is a chance that hospital personnel will need to cut it off on the day of surgery.  Contact lenses, hearing aids and dentures may not be worn into surgery.  Do not bring valuables to the hospital. [facility] is not responsible for any missing/lost belongings or valuables.   Notify your doctor if there is any change in your medical  condition (cold, fever, infection).  After surgery, you can help prevent lung complications by doing breathing exercises.  Take deep breaths and cough every 1-2 hours. Your doctor may order a device called an Incentive Spirometer to help you take deep breaths.  If you are being discharged the day of surgery, you will not be allowed to drive home. You will need a responsible individual to drive you home and stay with you for 24 hours after surgery.   Please call the Pre-admissions Testing Dept. at [phone] if you have any questions about these instructions.  Surgery Visitation Policy:  Patients having surgery or a procedure may have two visitors.   Merchandiser, Retail to address health-related social needs:  [email or web address]    Pre-operative 4 CHG Bath Instructions   You can play a key role in reducing the risk of infection after surgery. Your skin needs to be as free of germs as possible. You can reduce the number of germs on your skin by washing with CHG (chlorhexidine  gluconate) soap before surgery. CHG is an antiseptic soap that kills germs and continues to kill germs even after washing.   DO NOT use if you have an allergy to chlorhexidine /CHG or antibacterial soaps. If your skin becomes reddened or irritated, stop using the CHG and notify one of our RNs at [phone].   Please shower with the CHG soap starting 4 days before surgery using the following schedule:   Friday 06/20/24 - Tuesday 06/24/24    Please keep in mind the following:  DO NOT shave, including legs and underarms, starting the day of your first shower.   You may shave your face at any point before/day of surgery.  Place clean sheets on your bed the day you start using CHG soap. Use a clean washcloth (not used since being washed) for each shower. DO NOT sleep with pets once you start using the CHG.   CHG Shower Instructions:  If you choose to wash your hair and private area,  wash first with your normal shampoo/soap.  After you use shampoo/soap, rinse your hair and body thoroughly to remove shampoo/soap residue.  Turn the water OFF and apply about 3 tablespoons (45 ml) of CHG soap to a CLEAN washcloth.  Apply CHG soap ONLY FROM YOUR NECK DOWN TO YOUR TOES (washing for 3-5 minutes)  DO NOT use CHG soap on face, private areas, open wounds, or sores.  Pay special attention to the area where your surgery is being performed.  If you are having back surgery, having someone wash your back for you may be helpful. Wait 2 minutes after CHG soap is applied, then you may rinse off the CHG soap.  Pat dry with a clean towel  Put on clean clothes/pajamas   If you choose to wear lotion, please use ONLY the CHG-compatible lotions on the back of this paper.     Additional instructions for the day of surgery: DO NOT APPLY any lotions, deodorants, cologne, or perfumes.   Put on clean/comfortable clothes.  Brush your teeth.  Ask your nurse before applying any prescription medications  to the skin.      CHG Compatible Lotions   Aveeno Moisturizing lotion  Cetaphil Moisturizing Cream  Cetaphil Moisturizing Lotion  Clairol Herbal Essence Moisturizing Lotion, Dry Skin  Clairol Herbal Essence Moisturizing Lotion, Extra Dry Skin  Clairol Herbal Essence Moisturizing Lotion, Normal Skin  Curel Age Defying Therapeutic Moisturizing Lotion with Alpha Hydroxy  Curel Extreme Care Body Lotion  Curel Soothing Hands Moisturizing Hand Lotion  Curel Therapeutic Moisturizing Cream, Fragrance-Free  Curel Therapeutic Moisturizing Lotion, Fragrance-Free  Curel Therapeutic Moisturizing Lotion, Original Formula  Eucerin Daily Replenishing Lotion  Eucerin Dry Skin Therapy Plus Alpha Hydroxy Crme  Eucerin Dry Skin Therapy Plus Alpha Hydroxy Lotion  Eucerin Original Crme  Eucerin Original Lotion  Eucerin Plus Crme Eucerin Plus Lotion  Eucerin TriLipid Replenishing Lotion  Keri  Anti-Bacterial Hand Lotion  Keri Deep Conditioning Original Lotion Dry Skin Formula Softly Scented  Keri Deep Conditioning Original Lotion, Fragrance Free Sensitive Skin Formula  Keri Lotion Fast Absorbing Fragrance Free Sensitive Skin Formula  Keri Lotion Fast Absorbing Softly Scented Dry Skin Formula  Keri Original Lotion  Keri Skin Renewal Lotion Keri Silky Smooth Lotion  Keri Silky Smooth Sensitive Skin Lotion  Nivea Body Creamy Conditioning Oil  Nivea Body Extra Enriched Lotion  Nivea Body Original Lotion  Nivea Body Sheer Moisturizing Lotion Nivea Crme  Nivea Skin Firming Lotion  NutraDerm 30 Skin Lotion  NutraDerm Skin Lotion  NutraDerm Therapeutic Skin Cream  NutraDerm Therapeutic Skin Lotion  ProShield Protective Hand Cream  Provon moisturizing lotion  How to Use an Incentive Spirometer  An incentive spirometer is a tool that measures how well you are filling your lungs with each breath. Learning to take long, deep breaths using this tool can help you keep your lungs clear and active. This may help to reverse or lessen your chance of developing breathing (pulmonary) problems, especially infection. You may be asked to use a spirometer: After a surgery. If you have a lung problem or a history of smoking. After a long period of time when you have been unable to move or be active. If the spirometer includes an indicator to show the highest number that you have reached, your health care provider or respiratory therapist will help you set a goal. Keep a log of your progress as told by your health care provider. What are the risks? Breathing too quickly may cause dizziness or cause you to pass out. Take your time so you do not get dizzy or light-headed. If you are in pain, you may need to take pain medicine before doing incentive spirometry. It is harder to take a deep breath if you are having pain.   Sit up on the edge of your bed or on a chair. Hold the incentive spirometer so  that it is in an upright position. Before you use the spirometer, breathe out normally. Place the mouthpiece in your mouth. Make sure your lips are closed tightly around it. Breathe in slowly and as deeply as you can through your mouth, causing the piston or the ball to rise toward the top of the chamber. Hold your breath for 3-5 seconds, or for as long as possible. If the spirometer includes a coach indicator, use this to guide you in breathing. Slow down your breathing if the indicator goes above the marked areas. Remove the mouthpiece from your mouth and breathe out normally. The piston or ball will return to the bottom of the chamber. Rest for a few seconds, then repeat the steps  10 or more times. Take your time and take a few normal breaths between deep breaths so that you do not get dizzy or light-headed. Do this every 1-2 hours when you are awake. If the spirometer includes a goal marker to show the highest number you have reached (best effort), use this as a goal to work toward during each repetition. After each set of 10 deep breaths, cough a few times. This will help to make sure that your lungs are clear. If you have an incision on your chest or abdomen from surgery, place a pillow or a rolled-up towel firmly against the incision when you cough. This can help to reduce pain while taking deep breaths and coughing. General tips When you are able to get out of bed: Walk around often. Continue to take deep breaths and cough in order to clear your lungs. Keep using the incentive spirometer until your health care provider says it is okay to stop using it. If you have been in the hospital, you may be told to keep using the spirometer at home. Contact a health care provider if: You are having difficulty using the spirometer. You have trouble using the spirometer as often as instructed. Your pain medicine is not giving enough relief for you to use the spirometer as told. You have a  fever. Get help right away if: You develop shortness of breath. You develop a cough with bloody mucus from the lungs. You have fluid or blood coming from an incision site after you cough. Summary An incentive spirometer is a tool that can help you learn to take long, deep breaths to keep your lungs clear and active. You may be asked to use a spirometer after a surgery, if you have a lung problem or a history of smoking, or if you have been inactive for a long period of time. Use your incentive spirometer as instructed every 1-2 hours while you are awake. If you have an incision on your chest or abdomen, place a pillow or a rolled-up towel firmly against your incision when you cough. This will help to reduce pain. Get help right away if you have shortness of breath, you cough up bloody mucus, or blood comes from your incision when you cough. This information is not intended to replace advice given to you by your health care provider. Make sure you discuss any questions you have with your health care provider. Document Revised: 09/01/2019 Document Reviewed: 09/01/2019 Elsevier Patient Education  2023 [employer].

## 2024-06-16 ENCOUNTER — Encounter: Payer: Self-pay | Admitting: Surgery

## 2024-06-16 NOTE — Progress Notes (Signed)
 " Perioperative / Anesthesia Services  Pre-Admission Testing Clinical Review / Pre-Operative Anesthesia Consult  Date: 06/16/2024  PATIENT DEMOGRAPHICS: Name: [place] DOB: 12-18-1945 MRN:   [id number]  Note: Available PAT nursing documentation and vital signs have been reviewed. Clinical nursing staff has updated patient's PMH/PSHx, current medication list, and drug allergies/intolerances to ensure complete and comprehensive history available to assist care teams in MDM as it pertains to the aforementioned surgical procedure and anticipated anesthetic course. Extensive review of available clinical information personally performed. Nursing documentation reviewed. [place] PMH and PSHx updated with any diagnoses and/or procedures that I have knowledge of that may have been inadvertently omitted during his intake with the pre-admission testing department's nursing staff.  PLANNED SURGICAL PROCEDURE(S):   Case: [id number] Date/Time: 06/24/24 1007   Procedure: ARTHROPLASTY, KNEE, TOTAL (Right: Knee)   Anesthesia type: Choice   Diagnosis: Primary osteoarthritis of right knee [M17.11]   Pre-op diagnosis: Primary osteoarthritis of right knee   Location: [facility] OR ROOM 02 / [facility]S FOR ANEST[facility]   Surgeons: Edie Norleen PARAS, MD        CLINICAL DISCUSSION: Martin [place] is a 78 y.o. male who is submitted for pre-surgical anesthesia review and clearance prior to him undergoing the above procedure. Patient has never been a smoker in the past. Pertinent PMH includes: CAD, HFpEF, angina, aortic atherosclerosis, ascending aorta dilatation, HTN, HLD, T2DM, dyspnea, hiatal hernia, BPH, OA, diabetic polyneuropathy.  Patient is followed by cardiology Philippe, MD). He was last seen in the cardiology clinic on 05/29/2024; notes reviewed. At the time of his clinic visit, patient doing well overall from a cardiovascular perspective. Patient denied any chest pain, shortness of breath, PND, orthopnea,  palpitations, significant peripheral edema, weakness, fatigue, vertiginous symptoms, or presyncope/syncope. Patient with a past medical history significant for cardiovascular diagnoses. Documented physical exam was grossly benign, providing no evidence of acute exacerbation and/or decompensation of the patient's known cardiovascular conditions.  Most recent TTE performed on 12/05/2023 revealed a normal left ventricular systolic function with an EF of 85%. There was mild LVH.  There were no regional wall motion abnormalities. Left ventricular diastolic Doppler parameters consistent with abnormal relaxation (G1DD). Right ventricular size and function normal with a TAPSE measuring 2.3 cm  (normal range >/= 1.6 cm).  RVSP = 29 mmHg.  There was mild tricuspid and mitral valve regurgitation.  All transvalvular gradients were noted to be normal providing no evidence of hemodynamically significant valvular stenosis. Aorta normal in size with no evidence of ectasia or aneurysmal dilatation.  Most recent myocardial perfusion imaging study was performed on 12/11/2023 revealing a  normal left ventricular systolic function with an EF of 55-65%.  There were no regional wall motion abnormalities.  No artifact or left ventricular cavity size enlargement appreciated on review of imaging. SPECT images demonstrated no evidence of stress-induced myocardial ischemia or arrhythmia; no scintigraphic evidence of scar.  TID ratio = 0.98 (normal range </= 1.2). Study determined to be normal and low risk.  Coronary CTA was performed on 12/27/2023 that demonstrated an Agatston coronary artery calcium score of 1307. This placed patient in the 80th percentile for age, sex, and race matched controls. Calcium depositions noted to be isolated mainly in the LM, LAD, LCx, and RCA (25-49%) distributions.  Study demonstrated normal coronary origin with LEFT dominance.  Pulmonary artery and aorta noted to be of normal size/caliber with no evidence  of ectasia.  Blood pressure well controlled at 118/82 mmHg on currently prescribed ACEi (lisinopril) monotherapy.  Patient is on ezetimibe  + PCSK9i (evolocumab) + omega-3 fatty acid for his HLD diagnosis and ASCVD prevention. Patient is not diabetic. He does not have an OSAH diagnosis. Patient is able to complete all of his  ADL/IADLs without cardiovascular limitation.  Per the DASI, patient is able to achieve at least 4 METS of physical activity without experiencing any significant degree of angina/anginal equivalent symptoms. No changes were made to his medication regimen during his visit with cardiology.  Patient scheduled to follow-up with outpatient cardiology in 12 months or sooner if needed.  Martin [place] is scheduled for an elective ARTHROPLASTY, KNEE, TOTAL (Right: Knee) on 06/24/2024 with Dr. Norleen JINNY Maltos, MD. Given patient's past medical history significant for cardiovascular diagnoses, presurgical cardiac clearance was sought by the PAT team. Per cardiology, this patient is optimized for surgery and may proceed with the planned procedural course with a LOW risk of significant perioperative cardiovascular complications.  In review of the patient's medication reconciliation, it is noted that he is on daily oral antithrombotic therapy. He has been instructed on recommendations for holding his daily low-dose ASA for 7 days prior to his procedure with plans to restart as soon as postoperative bleeding risk felt to be minimized by his primary attending surgeon. The patient has been instructed that his last dose should be on 01/15/2024.  Patient denies previous perioperative complications with anesthesia in the past. In review his EMR, it is noted that patient underwent a general anesthetic course here at [facility] (ASA II) in 11/2018 without documented complications.   MOST RECENT VITAL SIGNS:    06/11/2024   10:50 AM 12/27/2023    9:16 AM 12/27/2023    8:41 AM   Vitals with BMI  Height 5' 8    Weight 144 lbs    BMI 21.9    Systolic 146 104 886  Diastolic 78 48 63  Pulse 83 53 54   PROVIDERS/SPECIALISTS: NOTE: Primary physician provider listed below. Patient may have been seen by APP or partner within same practice.   PROVIDER ROLE / SPECIALTY LAST OV  Poggi, Norleen JINNY, MD Previous (Surgeon) 05/05/2024  Alla Amis, MD Primary Care Provider 04/17/2024  Florencio Shine, MD Cardiology 05/29/2024   ALLERGIES: Allergies[1]  CURRENT HOME MEDICATIONS:  acetaminophen  (TYLENOL ) 500 MG tablet   amoxicillin-clavulanate (AUGMENTIN) 875-125 MG tablet   Ascorbic Acid  (VITAMIN C ) 1000 MG tablet   aspirin  EC 81 MG tablet   b complex vitamins capsule   Cholecalciferol (VITAMIN D) 125 MCG CAPS   CRANBERRY EXTRACT PO   ezetimibe  (ZETIA ) 10 MG tablet   glipiZIDE  (GLUCOTROL ) 10 MG tablet   INVOKANA 300 MG TABS tablet   LANTUS  SOLOSTAR 100 UNIT/ML Solostar Pen   lisinopril (ZESTRIL) 2.5 MG tablet   metFORMIN  (GLUCOPHAGE -XR) 500 MG 24 hr tablet   Multiple Vitamins-Minerals (ZINC PO)   Omega-3 Fatty Acids (FISH OIL PO)   OVER THE COUNTER MEDICATION   Probiotic Product (PROBIOTIC PO)   tamsulosin  (FLOMAX ) 0.4 MG CAPS capsule   XIIDRA 5 % SOLN   Evolocumab (REPATHA SURECLICK) 140 MG/ML SOAJ   HISTORY: Past Medical History:  Diagnosis Date   (HFpEF) heart failure with preserved ejection fraction (HCC)    Anginal pain    Aortic atherosclerosis    Arthritis    Ascending aorta dilatation    BPH (benign prostatic hyperplasia)    CAD (coronary artery disease)    Diabetes, polyneuropathy (HCC)    Hiatal hernia    HLD (  hyperlipidemia)    HTN (hypertension)    Long-term use of aspirin  therapy    Sepsis (HCC)    SOB (shortness of breath)    Statin intolerance    T2DM (type 2 diabetes mellitus) (HCC)    Past Surgical History:  Procedure Laterality Date   BACK SURGERY     CATARACT EXTRACTION, BILATERAL     COLONOSCOPY     ctr     dental  implant     KNEE SURGERY Left    cadaver acl repair   MUSCLE REPAIR Left    biceps   REVERSE SHOULDER ARTHROPLASTY Left 12/03/2018   Procedure: REVERSE SHOULDER ARTHROPLASTY [employer] [facility];  Surgeon: Edie Norleen PARAS, MD;  Location: [facility] ORS;  Service: Orthopedics;  Laterality: Left;   shoulder sugery Right    muscle repair   No family history on file. Social History   Tobacco Use   Smoking status: Never   Smokeless tobacco: Never  Substance Use Topics   Alcohol  use: No   LABS:  Hospital Outpatient Visit on 06/11/2024  Component Date Value Ref Range Status   WBC 06/11/2024 10.5  4.0 - 10.5 K/uL Final   RBC 06/11/2024 4.74  4.22 - 5.81 MIL/uL Final   Hemoglobin 06/11/2024 14.4  13.0 - 17.0 g/dL Final   HCT 87/82/7974 43.1  39.0 - 52.0 % Final   MCV 06/11/2024 90.9  80.0 - 100.0 fL Final   MCH 06/11/2024 30.4  26.0 - 34.0 pg Final   MCHC 06/11/2024 33.4  30.0 - 36.0 g/dL Final   RDW 87/82/7974 13.3  11.5 - 15.5 % Final   Platelets 06/11/2024 432 (H)  150 - 400 K/uL Final   nRBC 06/11/2024 0.0  0.0 - 0.2 % Final   Neutrophils Relative % 06/11/2024 71  % Final   Neutro Abs 06/11/2024 7.5  1.7 - 7.7 K/uL Final   Lymphocytes Relative 06/11/2024 19  % Final   Lymphs Abs 06/11/2024 1.9  0.7 - 4.0 K/uL Final   Monocytes Relative 06/11/2024 8  % Final   Monocytes Absolute 06/11/2024 0.8  0.1 - 1.0 K/uL Final   Eosinophils Relative 06/11/2024 0  % Final   Eosinophils Absolute 06/11/2024 0.0  0.0 - 0.5 K/uL Final   Basophils Relative 06/11/2024 1  % Final   Basophils Absolute 06/11/2024 0.1  0.0 - 0.1 K/uL Final   Immature Granulocytes 06/11/2024 1  % Final   Abs Immature Granulocytes 06/11/2024 0.13 (H)  0.00 - 0.07 K/uL Final   Performed at [facility], [address] [place], [place] [address]   Sodium 06/11/2024 135  135 - 145 mmol/L Final   Potassium 06/11/2024 3.9  3.5 - 5.1 mmol/L Final   Chloride 06/11/2024 95 (L)  98 - 111 mmol/L Final   CO2  06/11/2024 27  22 - 32 mmol/L Final   Glucose, Bld 06/11/2024 144 (H)  70 - 99 mg/dL Final   Glucose reference range applies only to samples taken after fasting for at least 8 hours.   BUN 06/11/2024 20  8 - 23 mg/dL Final   Creatinine, Ser 06/11/2024 0.72  0.61 - 1.24 mg/dL Final   Calcium 87/82/7974 9.8  8.9 - 10.3 mg/dL Final   Total Protein 87/82/7974 7.2  6.5 - 8.1 g/dL Final   Albumin 87/82/7974 4.6  3.5 - 5.0 g/dL Final   AST 87/82/7974 19  15 - 41 U/L Final   ALT 06/11/2024 17  0 - 44 U/L Final  Alkaline Phosphatase 06/11/2024 68  38 - 126 U/L Final   Total Bilirubin 06/11/2024 0.3  0.0 - 1.2 mg/dL Final   GFR, Estimated 06/11/2024 >60  >60 mL/min Final   Comment: (NOTE) Calculated using the CKD-EPI Creatinine Equation (2021)    Anion gap 06/11/2024 14  5 - 15 Final   Performed at [facility], [address] [place], [place] [address]   Color, Urine 06/11/2024 YELLOW (A)  YELLOW Final   APPearance 06/11/2024 CLEAR (A)  CLEAR Final   Specific Gravity, Urine 06/11/2024 1.027  1.005 - 1.030 Final   pH 06/11/2024 6.0  5.0 - 8.0 Final   Glucose, UA 06/11/2024 >=500 (A)  NEGATIVE mg/dL Final   Hgb urine dipstick 06/11/2024 NEGATIVE  NEGATIVE Final   Bilirubin Urine 06/11/2024 NEGATIVE  NEGATIVE Final   Ketones, ur 06/11/2024 NEGATIVE  NEGATIVE mg/dL Final   Protein, ur 87/82/7974 NEGATIVE  NEGATIVE mg/dL Final   Nitrite 87/82/7974 NEGATIVE  NEGATIVE Final   Leukocytes,Ua 06/11/2024 NEGATIVE  NEGATIVE Final   RBC / HPF 06/11/2024 0-5  0 - 5 RBC/hpf Final   WBC, UA 06/11/2024 0  0 - 5 WBC/hpf Final   Bacteria, UA 06/11/2024 NONE SEEN  NONE SEEN Final   Squamous Epithelial / HPF 06/11/2024 0  0 - 5 /HPF Final   Performed at [facility], [address] [place], [place] [address]   Hgb A1c MFr Bld 06/11/2024 7.8 (H)  4.8 - 5.6 % Final   Comment: (NOTE) Diagnosis of Diabetes The following HbA1c ranges recommended by the American Diabetes Association (ADA)  may be used as an aid in the diagnosis of diabetes mellitus.  Hemoglobin             Suggested A1C NGSP%              Diagnosis  <5.7                   Non Diabetic  5.7-6.4                Pre-Diabetic  >6.4                   Diabetic  <7.0                   Glycemic control for                       adults with diabetes.     Mean Plasma Glucose 06/11/2024 177.16  mg/dL Final   Performed at [facility], [address], [place], [place] [address]   MRSA, PCR 06/11/2024 NEGATIVE  NEGATIVE Final   Staphylococcus aureus 06/11/2024 NEGATIVE  NEGATIVE Final   Comment: (NOTE) The Xpert SA Assay (FDA approved for NASAL specimens in patients 67 years of age and older), is one component of a comprehensive surveillance program. It is not intended to diagnose infection nor to guide or monitor treatment. Performed at [facility], [address] [place], [place] [address]     ECG: Date: 06/11/2024  Time ECG obtained: 1206 PM Rate: 63 bpm Rhythm: Normal sinus rhythm with sinus arrhythmia Axis (leads I and aVF): normal Intervals: PR 172 ms. QRS 84 ms. QTc 384 ms. ST segment and T wave changes: No evidence of acute T wave abnormalities or significant ST segment elevation or depression.  Evidence of a possible, age undetermined, prior infarct:  No Comparison: Similar to previous tracing  obtained on 11/16/2023   IMAGING / PROCEDURES: MR KNEE RIGHT WO CONTRAST performed on 04/21/2024 Moderate tricompartmental osteoarthrosis most notably the medial compartment. There is full-thickness cartilage loss and reactive edema. There is a large joint effusion with moderate synovial hypertrophy. Complete disruption of the anterior cruciate ligament of uncertain acuity. No ligament is identified. Tear at the root of the medial meniscus with medial displacement of body. No displaced meniscal flap.  CT CORONARY MORPH W/CTA COR W/SCORE W/CA W/CM &/OR WO/CM performed on  12/27/2023 Coronary calcium score of 1307. This was 80th percentile for age and sex matched control. Normal coronary origin with left dominance. Mild stenosis (25-49%) noted in the LM, LAD, LCx, RCA. CAD-RADS 2. Mild non-obstructive CAD (25-49%). Consider non-atherosclerotic causes of chest pain. Consider preventive therapy and risk factor modification.  NM MYOCAR MULTI W/SPECT W/WALL MOTION / EF performed on 12/11/2023 No ST deviation was noted. LV perfusion is normal. There is no evidence of ischemia. There is no evidence of infarction  Left ventricular function is normal. Nuclear stress EF: 60%. The left ventricular ejection fraction is normal (55-65%). End diastolic cavity size is normal. Findings are consistent with no ischemia. The study is low risk.  TRANSTHORACIC ECHOCARDIOGRAM performed on 12/05/2023 Normal left ventricular systolic function with EF of 55% Mild LVH No regional wall motion abnormalities Left ventricular diastolic Doppler parameters consistent with abnormal relaxation (G1DD). Normal right ventricular size and function Mild MR and TR RVSP = 29 mmHg Normal gradients; no valvular stenosis No pericardial effusion  IMPRESSION AND PLAN: Martin [place] has been referred for pre-anesthesia review and clearance prior to him undergoing the planned anesthetic and procedural courses. Available labs, pertinent testing, and imaging results were personally reviewed by me in preparation for upcoming operative/procedural course. [facility] medical record has been updated following extensive record review and patient interview with PAT staff.   This patient has been appropriately cleared by cardiology with an overall LOW risk of patient experiencing significant perioperative cardiovascular complications. here at [facility]. Based on clinical review performed today (06/16/2024), barring any significant acute changes in the patient's overall condition,  it is anticipated that he will be able to proceed with the planned surgical intervention. Any acute changes in clinical condition may necessitate his procedure being postponed and/or cancelled. Patient will meet with anesthesia team (MD and/or CRNA) on the day of his procedure for preoperative evaluation/assessment. Questions regarding anesthetic course will be fielded at that time.   Pre-surgical instructions were reviewed with the patient during his PAT appointment, and questions were fielded to satisfaction by PAT clinical staff. He has been instructed on which medications that he will need to hold prior to surgery, as well as the ones that have been deemed safe/appropriate to take on the day of his procedure. As part of the general education provided by PAT, patient made aware both verbally and in writing, that he would need to abstain from the use of any illegal substances during his perioperative course. He was advised that failure to follow the provided instructions could necessitate case cancellation or result in serious perioperative complications up to and including death. Patient encouraged to contact PAT and/or his surgeon's office to discuss any questions or concerns that may arise prior to surgery; verbalized understanding.   Dorise Pereyra, MSN, APRN, FNP-C, CEN [facility]  Perioperative Services Nurse Practitioner Phone: [phone] Fax: [phone] 06/16/2024 10:37 AM  NOTE: This note has been prepared using Dragon dictation software.  Despite my best ability to proofread, there is always the potential that unintentional transcriptional errors may still occur from this process.    [1]  Allergies Allergen Reactions   Statins Other (See Comments)    Myalgia   Cefdinir Rash   "

## 2024-06-24 ENCOUNTER — Encounter
Admission: RE | Disposition: A | Discharge status: Home / Self Care | Payer: Self-pay | Source: Ambulatory Visit | Attending: Surgery

## 2024-06-24 ENCOUNTER — Other Ambulatory Visit: Payer: Self-pay

## 2024-06-24 ENCOUNTER — Ambulatory Visit: Payer: Self-pay | Admitting: Urgent Care

## 2024-06-24 ENCOUNTER — Encounter: Payer: Self-pay | Admitting: Surgery

## 2024-06-24 ENCOUNTER — Ambulatory Visit
Admission: RE | Admit: 2024-06-24 | Discharge: 2024-06-25 | Disposition: A | Discharge status: Home / Self Care | Source: Ambulatory Visit | Attending: Surgery | Admitting: Surgery

## 2024-06-24 ENCOUNTER — Ambulatory Visit

## 2024-06-24 DIAGNOSIS — E7800 Pure hypercholesterolemia, unspecified: Secondary | ICD-10-CM | POA: Insufficient documentation

## 2024-06-24 DIAGNOSIS — Z96651 Presence of right artificial knee joint: Secondary | ICD-10-CM

## 2024-06-24 DIAGNOSIS — Z7984 Long term (current) use of oral hypoglycemic drugs: Secondary | ICD-10-CM | POA: Insufficient documentation

## 2024-06-24 DIAGNOSIS — I5032 Chronic diastolic (congestive) heart failure: Secondary | ICD-10-CM | POA: Diagnosis not present

## 2024-06-24 DIAGNOSIS — M1711 Unilateral primary osteoarthritis, right knee: Secondary | ICD-10-CM | POA: Insufficient documentation

## 2024-06-24 DIAGNOSIS — E1369 Other specified diabetes mellitus with other specified complication: Secondary | ICD-10-CM | POA: Diagnosis not present

## 2024-06-24 DIAGNOSIS — E1142 Type 2 diabetes mellitus with diabetic polyneuropathy: Secondary | ICD-10-CM | POA: Insufficient documentation

## 2024-06-24 DIAGNOSIS — Z7982 Long term (current) use of aspirin: Secondary | ICD-10-CM | POA: Diagnosis not present

## 2024-06-24 DIAGNOSIS — Z794 Long term (current) use of insulin: Secondary | ICD-10-CM | POA: Insufficient documentation

## 2024-06-24 DIAGNOSIS — M1990 Unspecified osteoarthritis, unspecified site: Secondary | ICD-10-CM | POA: Diagnosis not present

## 2024-06-24 DIAGNOSIS — I700 Atherosclerosis of aorta: Secondary | ICD-10-CM | POA: Diagnosis not present

## 2024-06-24 DIAGNOSIS — Z79899 Other long term (current) drug therapy: Secondary | ICD-10-CM | POA: Diagnosis not present

## 2024-06-24 DIAGNOSIS — N400 Enlarged prostate without lower urinary tract symptoms: Secondary | ICD-10-CM | POA: Diagnosis not present

## 2024-06-24 DIAGNOSIS — I25119 Atherosclerotic heart disease of native coronary artery with unspecified angina pectoris: Secondary | ICD-10-CM | POA: Insufficient documentation

## 2024-06-24 DIAGNOSIS — I110 Hypertensive heart disease with heart failure: Secondary | ICD-10-CM | POA: Diagnosis not present

## 2024-06-24 DIAGNOSIS — Z96612 Presence of left artificial shoulder joint: Secondary | ICD-10-CM

## 2024-06-24 HISTORY — DX: Type 2 diabetes mellitus with diabetic polyneuropathy: E11.42

## 2024-06-24 HISTORY — DX: Other specified health status: Z78.9

## 2024-06-24 HISTORY — PX: TOTAL KNEE ARTHROPLASTY: SHX125

## 2024-06-24 HISTORY — DX: Atherosclerosis of aorta: I70.0

## 2024-06-24 HISTORY — DX: Essential (primary) hypertension: I10

## 2024-06-24 HISTORY — DX: Angina pectoris, unspecified: I20.9

## 2024-06-24 HISTORY — DX: Thoracic aortic ectasia: I77.810

## 2024-06-24 HISTORY — DX: Diaphragmatic hernia without obstruction or gangrene: K44.9

## 2024-06-24 HISTORY — DX: Atherosclerotic heart disease of native coronary artery without angina pectoris: I25.10

## 2024-06-24 HISTORY — DX: Long term (current) use of aspirin: Z79.82

## 2024-06-24 HISTORY — DX: Shortness of breath: R06.02

## 2024-06-24 HISTORY — DX: Hyperlipidemia, unspecified: E78.5

## 2024-06-24 HISTORY — DX: Unspecified diastolic (congestive) heart failure: I50.30

## 2024-06-24 HISTORY — DX: Type 2 diabetes mellitus without complications: E11.9

## 2024-06-24 HISTORY — DX: Benign prostatic hyperplasia without lower urinary tract symptoms: N40.0

## 2024-06-24 LAB — BASIC METABOLIC PANEL WITH GFR
Anion gap: 14 (ref 5–15)
BUN: 20 mg/dL (ref 8–23)
CO2: 23 mmol/L (ref 22–32)
Calcium: 9 mg/dL (ref 8.9–10.3)
Chloride: 95 mmol/L — ABNORMAL LOW (ref 98–111)
Creatinine, Ser: 1 mg/dL (ref 0.61–1.24)
GFR, Estimated: >60 mL/min
Glucose, Bld: 419 mg/dL — ABNORMAL HIGH (ref 70–99)
Potassium: 4.9 mmol/L (ref 3.5–5.1)
Sodium: 132 mmol/L — ABNORMAL LOW (ref 135–145)

## 2024-06-24 LAB — GLUCOSE, CAPILLARY
Glucose-Capillary: 168 mg/dL — ABNORMAL HIGH (ref 70–99)
Glucose-Capillary: 193 mg/dL — ABNORMAL HIGH (ref 70–99)
Glucose-Capillary: 282 mg/dL — ABNORMAL HIGH (ref 70–99)
Glucose-Capillary: 416 mg/dL — ABNORMAL HIGH (ref 70–99)

## 2024-06-24 SURGERY — ARTHROPLASTY, KNEE, TOTAL
Anesthesia: Spinal | Site: Knee | Laterality: Right

## 2024-06-24 MED ORDER — DEXAMETHASONE SOD PHOSPHATE PF 10 MG/ML IJ SOLN
INTRAMUSCULAR | Status: DC | PRN
  Administered 2024-06-24: 5 mg via INTRAVENOUS

## 2024-06-24 MED ORDER — BUPIVACAINE LIPOSOME 1.3 % IJ SUSP
INTRAMUSCULAR | Status: AC
  Filled 2024-06-24: qty 20

## 2024-06-24 MED ORDER — DOCUSATE SODIUM 100 MG PO CAPS
100.0000 mg | ORAL_CAPSULE | Freq: Two times a day (BID) | ORAL | Status: DC
  Administered 2024-06-24 – 2024-06-25 (×2): 100 mg via ORAL
  Filled 2024-06-24 (×2): qty 1

## 2024-06-24 MED ORDER — METOCLOPRAMIDE HCL 5 MG/ML IJ SOLN: 5.0000 mg | Freq: Three times a day (TID) | INTRAMUSCULAR | Status: DC | PRN

## 2024-06-24 MED ORDER — OXYCODONE HCL 5 MG PO TABS: 5.0000 mg | ORAL_TABLET | ORAL | Status: DC | PRN

## 2024-06-24 MED ORDER — OXYCODONE HCL 5 MG PO TABS
ORAL_TABLET | ORAL | Status: AC
  Filled 2024-06-24: qty 1

## 2024-06-24 MED ORDER — APIXABAN 2.5 MG PO TABS: 2.5000 mg | ORAL_TABLET | Freq: Two times a day (BID) | ORAL | Status: AC

## 2024-06-24 MED ORDER — LISINOPRIL 5 MG PO TABS
2.5000 mg | ORAL_TABLET | Freq: Every day | ORAL | Status: DC
  Administered 2024-06-25: 2.5 mg via ORAL
  Filled 2024-06-24: qty 1

## 2024-06-24 MED ORDER — INSULIN ASPART 100 UNIT/ML IJ SOLN
0.0000 [IU] | Freq: Three times a day (TID) | INTRAMUSCULAR | Status: DC
  Administered 2024-06-24: 8 [IU] via SUBCUTANEOUS
  Administered 2024-06-25: 5 [IU] via SUBCUTANEOUS
  Filled 2024-06-24: qty 8
  Filled 2024-06-24: qty 5

## 2024-06-24 MED ORDER — GLIPIZIDE 10 MG PO TABS
20.0000 mg | ORAL_TABLET | Freq: Two times a day (BID) | ORAL | Status: DC
  Administered 2024-06-24 – 2024-06-25 (×2): 20 mg via ORAL
  Filled 2024-06-24 (×3): qty 2

## 2024-06-24 MED ORDER — PHENYLEPHRINE HCL-NACL 20-0.9 MG/250ML-% IV SOLN
INTRAVENOUS | Status: AC
  Filled 2024-06-24: qty 250

## 2024-06-24 MED ORDER — ONDANSETRON HCL 4 MG/2ML IJ SOLN: 4.0000 mg | Freq: Four times a day (QID) | INTRAMUSCULAR | Status: DC | PRN

## 2024-06-24 MED ORDER — PHENYLEPHRINE HCL-NACL 20-0.9 MG/250ML-% IV SOLN
INTRAVENOUS | Status: DC | PRN
  Administered 2024-06-24: 50 ug/min via INTRAVENOUS
  Administered 2024-06-24: 40 ug/min via INTRAVENOUS

## 2024-06-24 MED ORDER — FENTANYL CITRATE (PF) 100 MCG/2ML IJ SOLN
INTRAMUSCULAR | Status: AC
  Filled 2024-06-24: qty 2

## 2024-06-24 MED ORDER — METOCLOPRAMIDE HCL 10 MG PO TABS: 5.0000 mg | ORAL_TABLET | Freq: Three times a day (TID) | ORAL | Status: DC | PRN

## 2024-06-24 MED ORDER — STERILE WATER FOR IRRIGATION IR SOLN
Status: DC | PRN
  Administered 2024-06-24: 1000 mL

## 2024-06-24 MED ORDER — EZETIMIBE 10 MG PO TABS
10.0000 mg | ORAL_TABLET | Freq: Every day | ORAL | Status: DC
  Administered 2024-06-25: 10 mg via ORAL
  Filled 2024-06-24: qty 1

## 2024-06-24 MED ORDER — SODIUM CHLORIDE 0.9 % IV SOLN: INTRAVENOUS | Status: DC

## 2024-06-24 MED ORDER — ONDANSETRON HCL 4 MG/2ML IJ SOLN
INTRAMUSCULAR | Status: DC | PRN
  Administered 2024-06-24: 4 mg via INTRAVENOUS

## 2024-06-24 MED ORDER — TRANEXAMIC ACID-NACL 1000-0.7 MG/100ML-% IV SOLN
INTRAVENOUS | Status: AC
  Filled 2024-06-24: qty 100

## 2024-06-24 MED ORDER — LEVOFLOXACIN IN D5W 500 MG/100ML IV SOLN
500.0000 mg | INTRAVENOUS | Status: AC
  Administered 2024-06-24: 500 mg via INTRAVENOUS

## 2024-06-24 MED ORDER — PROPOFOL 10 MG/ML IV BOLUS
INTRAVENOUS | Status: DC | PRN
  Administered 2024-06-24: 40 ug/kg/min via INTRAVENOUS
  Administered 2024-06-24: 20 mg via INTRAVENOUS
  Administered 2024-06-24: 30 mg via INTRAVENOUS

## 2024-06-24 MED ORDER — PROPOFOL 10 MG/ML IV BOLUS
INTRAVENOUS | Status: AC
  Filled 2024-06-24: qty 20

## 2024-06-24 MED ORDER — VANCOMYCIN HCL IN DEXTROSE 1-5 GM/200ML-% IV SOLN
1000.0000 mg | Freq: Two times a day (BID) | INTRAVENOUS | Status: AC
  Administered 2024-06-24: 1000 mg via INTRAVENOUS
  Filled 2024-06-24: qty 200

## 2024-06-24 MED ORDER — TRIAMCINOLONE ACETONIDE 40 MG/ML IJ SUSP
INTRAMUSCULAR | Status: DC | PRN
  Administered 2024-06-24: 93 mL

## 2024-06-24 MED ORDER — ACETAMINOPHEN 325 MG PO TABS: 325.0000 mg | ORAL_TABLET | Freq: Four times a day (QID) | ORAL | Status: DC | PRN

## 2024-06-24 MED ORDER — DIPHENHYDRAMINE HCL 12.5 MG/5ML PO ELIX: 12.5000 mg | ORAL_SOLUTION | ORAL | Status: DC | PRN

## 2024-06-24 MED ORDER — BISACODYL 10 MG RE SUPP: 10.0000 mg | Freq: Every day | RECTAL | Status: DC | PRN

## 2024-06-24 MED ORDER — CHLORHEXIDINE GLUCONATE 0.12 % MT SOLN
OROMUCOSAL | Status: AC
  Filled 2024-06-24: qty 15

## 2024-06-24 MED ORDER — EPHEDRINE 5 MG/ML INJ
INTRAVENOUS | Status: AC
  Filled 2024-06-24: qty 5

## 2024-06-24 MED ORDER — ACETAMINOPHEN 10 MG/ML IV SOLN
INTRAVENOUS | Status: DC | PRN
  Administered 2024-06-24: 1000 mg via INTRAVENOUS

## 2024-06-24 MED ORDER — BUPIVACAINE HCL (PF) 0.5 % IJ SOLN
INTRAMUSCULAR | Status: AC
  Filled 2024-06-24: qty 10

## 2024-06-24 MED ORDER — TRANEXAMIC ACID-NACL 1000-0.7 MG/100ML-% IV SOLN
1000.0000 mg | INTRAVENOUS | Status: AC
  Administered 2024-06-24: 1000 mg via INTRAVENOUS

## 2024-06-24 MED ORDER — TAMSULOSIN HCL 0.4 MG PO CAPS
0.4000 mg | ORAL_CAPSULE | Freq: Two times a day (BID) | ORAL | Status: DC
  Administered 2024-06-24 – 2024-06-25 (×2): 0.4 mg via ORAL
  Filled 2024-06-24 (×2): qty 1

## 2024-06-24 MED ORDER — MAGNESIUM HYDROXIDE 400 MG/5ML PO SUSP: 30.0000 mL | Freq: Every day | ORAL | Status: DC | PRN

## 2024-06-24 MED ORDER — LIFITEGRAST 5 % OP SOLN: 1.0000 [drp] | Freq: Two times a day (BID) | OPHTHALMIC | Status: DC

## 2024-06-24 MED ORDER — LEVOFLOXACIN IN D5W 500 MG/100ML IV SOLN
INTRAVENOUS | Status: AC
  Filled 2024-06-24: qty 100

## 2024-06-24 MED ORDER — VANCOMYCIN HCL IN DEXTROSE 1-5 GM/200ML-% IV SOLN
INTRAVENOUS | Status: AC
  Filled 2024-06-24: qty 200

## 2024-06-24 MED ORDER — FENTANYL CITRATE (PF) 100 MCG/2ML IJ SOLN
INTRAMUSCULAR | Status: DC | PRN
  Administered 2024-06-24 (×4): 25 ug via INTRAVENOUS

## 2024-06-24 MED ORDER — SODIUM CHLORIDE 0.9 % BOLUS PEDS
250.0000 mL | Freq: Once | INTRAVENOUS | Status: AC
  Administered 2024-06-24: 250 mL via INTRAVENOUS

## 2024-06-24 MED ORDER — ACETAMINOPHEN 500 MG PO TABS
1000.0000 mg | ORAL_TABLET | Freq: Four times a day (QID) | ORAL | Status: DC
  Administered 2024-06-24 – 2024-06-25 (×3): 1000 mg via ORAL
  Filled 2024-06-24 (×3): qty 2

## 2024-06-24 MED ORDER — FENTANYL CITRATE (PF) 100 MCG/2ML IJ SOLN: 25.0000 ug | INTRAMUSCULAR | Status: DC | PRN

## 2024-06-24 MED ORDER — ACETAMINOPHEN 10 MG/ML IV SOLN
INTRAVENOUS | Status: AC
  Filled 2024-06-24: qty 100

## 2024-06-24 MED ORDER — KETOROLAC TROMETHAMINE 15 MG/ML IJ SOLN
INTRAMUSCULAR | Status: AC
  Filled 2024-06-24: qty 1

## 2024-06-24 MED ORDER — OXYCODONE HCL 5 MG PO TABS
5.0000 mg | ORAL_TABLET | Freq: Once | ORAL | Status: AC | PRN
  Administered 2024-06-24: 5 mg via ORAL

## 2024-06-24 MED ORDER — PROPOFOL 1000 MG/100ML IV EMUL
INTRAVENOUS | Status: AC
  Filled 2024-06-24: qty 100

## 2024-06-24 MED ORDER — CHLORHEXIDINE GLUCONATE 0.12 % MT SOLN
15.0000 mL | Freq: Once | OROMUCOSAL | Status: AC
  Administered 2024-06-24: 15 mL via OROMUCOSAL

## 2024-06-24 MED ORDER — ONDANSETRON HCL 4 MG PO TABS: 4.0000 mg | ORAL_TABLET | Freq: Four times a day (QID) | ORAL | Status: DC | PRN

## 2024-06-24 MED ORDER — EPHEDRINE SULFATE-NACL 50-0.9 MG/10ML-% IV SOSY
PREFILLED_SYRINGE | INTRAVENOUS | Status: DC | PRN
  Administered 2024-06-24: 15 mg via INTRAVENOUS
  Administered 2024-06-24 (×2): 5 mg via INTRAVENOUS
  Administered 2024-06-24: 10 mg via INTRAVENOUS

## 2024-06-24 MED ORDER — FLEET ENEMA RE ENEM: 1.0000 | ENEMA | Freq: Once | RECTAL | Status: DC | PRN

## 2024-06-24 MED ORDER — SODIUM CHLORIDE 0.9 % IR SOLN
Status: DC | PRN
  Administered 2024-06-24: 3000 mL

## 2024-06-24 MED ORDER — APIXABAN 2.5 MG PO TABS
2.5000 mg | ORAL_TABLET | Freq: Two times a day (BID) | ORAL | Status: DC
  Administered 2024-06-25: 2.5 mg via ORAL
  Filled 2024-06-24: qty 1

## 2024-06-24 MED ORDER — ONDANSETRON HCL 4 MG/2ML IJ SOLN
INTRAMUSCULAR | Status: AC
  Filled 2024-06-24: qty 2

## 2024-06-24 MED ORDER — ORAL CARE MOUTH RINSE: 15.0000 mL | Freq: Once | OROMUCOSAL | Status: AC

## 2024-06-24 MED ORDER — INSULIN ASPART 100 UNIT/ML IJ SOLN
12.0000 [IU] | Freq: Once | INTRAMUSCULAR | Status: AC
  Administered 2024-06-24: 12 [IU] via SUBCUTANEOUS
  Filled 2024-06-24: qty 12

## 2024-06-24 MED ORDER — VANCOMYCIN HCL IN DEXTROSE 1-5 GM/200ML-% IV SOLN
1000.0000 mg | INTRAVENOUS | Status: AC
  Administered 2024-06-24: 1000 mg via INTRAVENOUS

## 2024-06-24 MED ORDER — METFORMIN HCL ER 500 MG PO TB24
1000.0000 mg | ORAL_TABLET | Freq: Two times a day (BID) | ORAL | Status: DC
  Administered 2024-06-24 – 2024-06-25 (×2): 1000 mg via ORAL
  Filled 2024-06-24 (×3): qty 2

## 2024-06-24 MED ORDER — MIDAZOLAM HCL 2 MG/2ML IJ SOLN
INTRAMUSCULAR | Status: AC
  Filled 2024-06-24: qty 2

## 2024-06-24 MED ORDER — KETOROLAC TROMETHAMINE 15 MG/ML IJ SOLN
7.5000 mg | Freq: Four times a day (QID) | INTRAMUSCULAR | Status: DC
  Administered 2024-06-24 – 2024-06-25 (×3): 7.5 mg via INTRAVENOUS
  Filled 2024-06-24 (×3): qty 1

## 2024-06-24 MED ORDER — EMPAGLIFLOZIN 25 MG PO TABS
25.0000 mg | ORAL_TABLET | Freq: Every day | ORAL | Status: DC
  Administered 2024-06-25: 25 mg via ORAL
  Filled 2024-06-24: qty 1

## 2024-06-24 MED ORDER — INSULIN GLARGINE 100 UNIT/ML ~~LOC~~ SOLN
10.0000 [IU] | SUBCUTANEOUS | Status: DC
  Administered 2024-06-24: 10 [IU] via SUBCUTANEOUS
  Filled 2024-06-24: qty 0.1

## 2024-06-24 MED ORDER — OXYCODONE HCL 5 MG/5ML PO SOLN: 5.0000 mg | Freq: Once | ORAL | Status: AC | PRN

## 2024-06-24 MED ORDER — BUPIVACAINE HCL (PF) 0.5 % IJ SOLN
INTRAMUSCULAR | Status: DC | PRN
  Administered 2024-06-24: 2.6 mL via INTRATHECAL

## 2024-06-24 MED ORDER — BUPIVACAINE-EPINEPHRINE (PF) 0.5% -1:200000 IJ SOLN
INTRAMUSCULAR | Status: AC
  Filled 2024-06-24: qty 30

## 2024-06-24 MED ORDER — TRIAMCINOLONE ACETONIDE 40 MG/ML IJ SUSP
INTRAMUSCULAR | Status: AC
  Filled 2024-06-24: qty 2

## 2024-06-24 MED ORDER — OXYCODONE HCL 5 MG PO TABS: 5.0000 mg | ORAL_TABLET | ORAL | Status: AC | PRN

## 2024-06-24 MED ORDER — KETOROLAC TROMETHAMINE 30 MG/ML IJ SOLN
INTRAMUSCULAR | Status: AC
  Filled 2024-06-24: qty 1

## 2024-06-24 MED ORDER — DEXMEDETOMIDINE HCL IN NACL 80 MCG/20ML IV SOLN
INTRAVENOUS | Status: DC | PRN
  Administered 2024-06-24 (×2): 8 ug via INTRAVENOUS

## 2024-06-24 MED ORDER — KETOROLAC TROMETHAMINE 15 MG/ML IJ SOLN
15.0000 mg | Freq: Once | INTRAMUSCULAR | Status: AC
  Administered 2024-06-24: 15 mg via INTRAVENOUS

## 2024-06-24 MED ORDER — SODIUM CHLORIDE (PF) 0.9 % IJ SOLN
INTRAMUSCULAR | Status: AC
  Filled 2024-06-24: qty 40

## 2024-06-24 MED ORDER — MIDAZOLAM HCL (PF) 2 MG/2ML IJ SOLN
INTRAMUSCULAR | Status: DC | PRN
  Administered 2024-06-24: 2 mg via INTRAVENOUS

## 2024-06-24 SURGICAL SUPPLY — 48 items
BLADE SAW 90X13X1.19 OSCILLAT (BLADE) ×1 IMPLANT
BLADE SAW SAG 25X90X1.19 (BLADE) ×1 IMPLANT
BLADE SURG SZ20 CARB STEEL (BLADE) ×1 IMPLANT
BNDG COMPR 6X5.8 VLCR NS LF (GAUZE/BANDAGES/DRESSINGS) ×1 IMPLANT
CEMENT VACUUM MIXING SYSTEM (MISCELLANEOUS) ×1 IMPLANT
CHLORAPREP W/TINT 26 (MISCELLANEOUS) ×1 IMPLANT
COMPONENT PATELLA 3 PEG 35 (Joint) IMPLANT
COOLER ICEMAN CLASSIC (MISCELLANEOUS) ×1 IMPLANT
COVER MAYO STAND STRL (DRAPES) ×1 IMPLANT
CUFF TRNQT CYL 24X4X16.5-23 (TOURNIQUET CUFF) IMPLANT
DRAPE IMP U-DRAPE 54X76 (DRAPES) ×1 IMPLANT
DRAPE SHEET LG 3/4 BI-LAMINATE (DRAPES) ×1 IMPLANT
DRAPE U-SHAPE 47X51 STRL (DRAPES) ×1 IMPLANT
DRSG MEPILEX SACRM 8.7X9.8 (GAUZE/BANDAGES/DRESSINGS) IMPLANT
DRSG OPSITE POSTOP 4X10 (GAUZE/BANDAGES/DRESSINGS) IMPLANT
DRSG OPSITE POSTOP 4X8 (GAUZE/BANDAGES/DRESSINGS) IMPLANT
ELECT CAUTERY BLADE 6.4 (BLADE) ×1 IMPLANT
ELECTRODE REM PT RTRN 9FT ADLT (ELECTROSURGICAL) ×1 IMPLANT
GAUZE XEROFORM 1X8 LF (GAUZE/BANDAGES/DRESSINGS) ×1 IMPLANT
GLOVE BIO SURGEON STRL SZ7.5 (GLOVE) ×4 IMPLANT
GLOVE BIO SURGEON STRL SZ8 (GLOVE) ×4 IMPLANT
GLOVE BIOGEL PI IND STRL 8 (GLOVE) ×2 IMPLANT
GOWN STRL REUS W/ TWL LRG LVL3 (GOWN DISPOSABLE) IMPLANT
GOWN STRL REUS W/ TWL XL LVL3 (GOWN DISPOSABLE) ×1 IMPLANT
HOOD PEEL AWAY T7 (MISCELLANEOUS) ×3 IMPLANT
INSERT TIBIA KNEE RIGHT 10 (Joint) IMPLANT
KIT TURNOVER KIT A (KITS) ×1 IMPLANT
MANIFOLD NEPTUNE II (INSTRUMENTS) ×1 IMPLANT
NEEDLE SPNL 20GX3.5 QUINCKE YW (NEEDLE) ×1 IMPLANT
NS IRRIG 500ML POUR BTL (IV SOLUTION) ×1 IMPLANT
PACK TOTAL KNEE (MISCELLANEOUS) ×1 IMPLANT
PAD COLD UNI WRAP-ON (PAD) ×1 IMPLANT
PENCIL SMOKE EVACUATOR (MISCELLANEOUS) ×1 IMPLANT
PIN DRILL HDLS TROCAR 75 4PK (PIN) IMPLANT
PROSTHESIS FEM KNEE PS STD9 RT (Joint) IMPLANT
PROSTHESIS TIB KNEE PS 0D F RT (Joint) IMPLANT
SCREW FEMALE HEX FIX 25X2.5 (ORTHOPEDIC DISPOSABLE SUPPLIES) IMPLANT
SOL .9 NS 3000ML IRR UROMATIC (IV SOLUTION) ×1 IMPLANT
SOLN STERILE WATER 500 ML (IV SOLUTION) ×1 IMPLANT
STAPLER SKIN PROX 35W (STAPLE) ×1 IMPLANT
STOCKINETTE IMPERV 14X48 (MISCELLANEOUS) ×1 IMPLANT
SUCTION TUBE FRAZIER 10FR DISP (SUCTIONS) ×1 IMPLANT
SUT VIC AB 0 CT1 36 (SUTURE) ×3 IMPLANT
SUT VIC AB 2-0 CT1 TAPERPNT 27 (SUTURE) ×3 IMPLANT
SYR 10ML LL (SYRINGE) ×1 IMPLANT
SYR 30ML LL (SYRINGE) IMPLANT
TIP FAN IRRIG PULSAVAC PLUS (DISPOSABLE) ×1 IMPLANT
TRAP FLUID SMOKE EVACUATOR (MISCELLANEOUS) ×1 IMPLANT

## 2024-06-24 NOTE — Anesthesia Procedure Notes (Signed)
 Spinal  Patient location during procedure: OR Start time: 06/24/2024 10:19 AM End time: 06/24/2024 10:26 AM Reason for block: surgical anesthesia  Staffing Performed: resident/CRNA  Authorized by: Leavy Ned, MD   Performed by: Lorrene Camelia LABOR, CRNA  Preanesthetic Checklist Completed: patient identified, IV checked, site marked, risks and benefits discussed, surgical consent, monitors and equipment checked and pre-op evaluation Spinal Block Patient position: sitting Prep: ChloraPrep Patient monitoring: heart rate, continuous pulse ox, blood pressure and cardiac monitor Approach: midline Location: L4-5 Injection technique: single-shot Needle Needle type: Introducer and Pencan  Needle gauge: 22 G Needle length: 9 cm Assessment Events: CSF return  Additional Notes Negative paresthesia. Negative blood return. Positive free-flowing CSF. Expiration date of kit checked and confirmed. Patient tolerated procedure well, without complications.

## 2024-06-24 NOTE — Progress Notes (Signed)
 Patient awake/alert x4. Patient tolerated lunch 100%. Spinal anesthesia: pulses intact +2 at present not moving bilateral lower ext. Patient able to speak to wife on phone. Wanting to go home.

## 2024-06-24 NOTE — H&P (Signed)
 History of Present Illness: Martin Watts is a 78 y.o. male who presents today for his surgical history and physical for his upcoming right total knee arthroplasty. Surgery scheduled with Dr. Edie on 06/24/2024. The patient denies any changes in his medical history since his last evaluation. He denies any falls or trauma affecting the right knee. He reports a 4 out of 10 pain toward the right knee at today's appointment, he is taking Tylenol  as needed for discomfort. He denies any personal history of heart attack, stroke, asthma or COPD. No personal history of blood clots. He is a diabetic, most recent A1c was 7.3.  Past Medical History: Allergy 11/2003 (Statins)  Arthritis 09/1998  Pure hypercholesterolemia - intolerant to statins  Type 2 diabetes mellitus with hyperlipidemia   Past Surgical History: Right Ring Trigger Finger Release 05/03/18 (Dr. 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 Edie)  COLONOSCOPY 05/07/2020 (Dr. IVAR Boss @ [facility] - nml colonic mucosa/No Repeat due to age/TKT)  CATARACT EXTRACTION Right 05/31/2021  acl replacement Left  BACK SURGERY  ENDOSCOPIC CARPAL TUNNEL RELEASE  REPAIR SUBSCAPULARIS MUSCLE   Past Family History: Stroke Mother  Ataxia Mother  No Known Problems Father  Ataxia Sister  Huntington's disease Sister  Ataxia Brother   Medications: ACCU-CHEK GUIDE TEST STRIPS test strip USE AS INSTRUCTED. USE TO CHECK BLOOD SUGAR TWICE DAILY ACCU-CHEK GUIDE DX E11.69 200 strip 1  acetaminophen  (TYLENOL ) 500 MG tablet Take 1,000 mg by mouth 2 (two) times daily (Patient not taking: Reported on 06/07/2024)  amoxicillin-clavulanate (AUGMENTIN) 875-125 mg tablet Take 1 tablet (875 mg total) by mouth 2 (two) times daily for 10 days 20 tablet 0  ascorbic acid , vitamin C , (VITAMIN C ) 1000 MG tablet Take by mouth Take 1,000 mg by mouth daily.  aspirin  81 MG EC tablet Take 1 tablet (81 mg total) by mouth once daily 30 tablet 11  b complex  vitamins capsule Take 1 capsule by mouth once daily.  blood glucose meter kit USE TO CHECK BLOOD SUGAR TWICE DAILY ACCU-CHEK DX E11.69 1 each 0  blood-glucose sensor (DEXCOM G7 SENSOR) Devi Use 1 each every 10 (ten) days Use sensor for glucose monitoring 9 each 3  blood-glucose,receiver,cont (DEXCOM G7 RECEIVER) Misc Use 1 each as directed Use receiver for glucose monitoring 1 each 1  canagliflozin (INVOKANA) 300 mg tablet TAKE 1 TABLET BY MOUTH EVERY MORNING BEFORE BREAKFAST 90 tablet 1  cholecalciferol, vitamin D3, (VITAMIN D3) 125 mcg (5,000 unit) tablet Take 5,000 Units by mouth once daily  cycloSPORINE (RESTASIS) 0.05 % ophthalmic emulsion Place 1 drop into both eyes 2 (two) times daily (Patient not taking: Reported on 06/07/2024)  DIETARY SUPPLEMENT ORAL Take by mouth once daily Green Lipped Muscle Extract  docosahexaenoic acid/epa (FISH OIL ORAL) Take by mouth once daily  ezetimibe  (ZETIA ) 10 mg tablet TAKE 1 TABLET BY MOUTH ONCE DAILY 90 tablet 3  glipiZIDE  (GLUCOTROL ) 10 MG tablet TAKE 2 TABLETS BY MOUTH TWICE A DAY BEFORE MEALS 360 tablet 3  ibuprofen (MOTRIN) 200 MG tablet Take 400 mg by mouth as needed for Pain  insulin  GLARGINE (LANTUS  SOLOSTAR U-100 INSULIN ) pen injector (concentration 100 units/mL) Inject 10 Units subcutaneously at bedtime 15 mL 3  lancets (ACCU-CHEK FASTCLIX LANCET DRUM) CHECK BLOOD SUGARS TWICE DAILY. ACCU-CHEK DX E11.69 204 each 1  lisinopriL (ZESTRIL) 2.5 MG tablet TAKE 1 TABLET BY MOUTH DAILY 90 tablet 3  magnesium  oxide (MAG-OX) 400 mg (241.3 mg magnesium ) tablet Take 400 mg by mouth once daily  metFORMIN  (GLUCOPHAGE -XR) 500 MG XR tablet TAKE 2 TABLETS TWICE A DAY WITH MEALS 360 tablet 3  multivitamin tablet Take 1 tablet by mouth once daily.  pen needle, diabetic (PEN NEEDLE) 31 gauge x 5/16 needle Use as directed (Use once daily with insulin . Dx E11.69) 100 each 1  REPATHA SURECLICK 140 mg/mL PnIj Inject 140 mg subcutaneously every 14 (fourteen) days 6 mL 3   tadalafiL (CIALIS) 10 MG tablet Take dose 30-45 min prior to anticipated sexual activity. 10 tablet 6  tamsulosin  (FLOMAX ) 0.4 mg capsule TAKE 1 CAPSULE (0.4 MG TOTAL) BY MOUTH ONCE DAILY, 30 MINUTES AFTER SAME MEAL EACH DAY. 90 capsule 1  TURMERIC ORAL Take by mouth once daily  XIIDRA 5 % ophthalmic solution Place 1 drop into both eyes 2 (two) times daily  ZINC ORAL Take by mouth One daily   Allergies: Cefdinir - Rash  Statins-Hmg-Coa Reductase Inhibitors - Muscle Pain   Review of Systems:  A comprehensive 14 point ROS was performed, reviewed by me today, and the pertinent orthopaedic findings are documented in the HPI.  Physical Exam: BP 134/70  Ht 172.7 cm (5' 8)  Wt 66.9 kg (147 lb 6.4 oz)  BMI 22.41 kg/m  General/Constitutional: The patient appears to be well-nourished, well-developed, and in no acute distress. Neuro/Psych: Normal mood and affect, oriented to person, place and time. Eyes: Non-icteric. Pupils are equal, round, and reactive to light, and exhibit synchronous movement. ENT: Unremarkable. Lymphatic: No palpable adenopathy. Respiratory: Lungs clear to auscultation, Normal chest excursion, No wheezes, and Non-labored breathing Cardiovascular: Regular rate and rhythm. No murmurs. and No edema, swelling or tenderness, except as noted in detailed exam. Integumentary: No impressive skin lesions present, except as noted in detailed exam. Musculoskeletal: Unremarkable, except as noted in detailed exam.  Right knee exam: GAIT: Mild limp favoring his right leg, but uses no assistive devices. ALIGNMENT: Normal SKIN: Unremarkable SWELLING: Mild EFFUSION: Small WARMTH: None TENDERNESS: Moderate focal tenderness over the poster-medial joint line, but no lateral or peripatellar tenderness ROM: 0-140 degrees with pain in maximal flexion McMURRAY'S: Mildly positive PATELLOFEMORAL: Normal tracking with no peri-patellar tenderness and negative apprehension sign CREPITUS:  No LACHMAN'S: Negative PIVOT SHIFT: Not evaluated ANTERIOR DRAWER: Negative POSTERIOR DRAWER: Negative VARUS/VALGUS: Stable  He remains neurovascularly intact to the right lower extremity and foot.  Imaging:  There is an MRI scan of the right knee is available for review and has been reviewed by myself. By report, the study demonstrates evidence of moderate tricompartmental osteoarthrosis most notably the medial compartment. There is full-thickness articular cartilage loss and reactive edema. There is a large joint effusion with moderate synovial hypertrophy. The study also demonstrates a tear at the root of the medial meniscus with medial displacement of body. No displaced meniscal flap. Finally, it demonstrates evidence of a complete disruption of the anterior cruciate ligament of uncertain acuity. No ligament is identified.   AP weightbearing of both knees, as well as lateral and merchant views of the right knee were obtained in a previous visit and reviewed by me today. These films demonstrate mild degenerative changes, primarily involving the medial compartment with 20% medial joint space narrowing. Lesser degenerative changes of the patellofemoral compartment also are noted. Overall alignment is neutral. No fractures, lytic lesions, or abnormal calcifications are noted.   Impression: 1. Complex tear of medial meniscus of right knee. 2. Primary osteoarthritis of right knee. 3. Knee effusion, right.  Plan:  1. Treatment options were discussed today with the patient. 2. The  patient is scheduled to undergo a right total knee arthroplasty with Dr. Edie on 06/24/2024. 3. The patient was instructed on the risk and benefits of surgical intervention and wishes to proceed at this time. 4. This document will serve as a surgical history and physical for the patient. 5. The patient will follow-up per standard postop protocol. They can call the clinic they have any questions, new symptoms  develop or symptoms worsen.  The procedure was discussed with the patient, as were the potential risks (including bleeding, infection, nerve and/or blood vessel injury, persistent or recurrent pain, failure of the hardware, stiffness, need for further surgery, blood clots, strokes, heart attacks and/or arhythmias, pneumonia, etc.) and benefits. The patient states his understanding and wishes to proceed.    H&P reviewed and patient re-examined. No changes.

## 2024-06-24 NOTE — Transfer of Care (Signed)
 Immediate Anesthesia Transfer of Care Note  Patient: Martin Watts  Procedure(s) Performed: ARTHROPLASTY, KNEE, TOTAL (Right: Knee)  Patient Location: PACU  Anesthesia Type:MAC and Spinal  Level of Consciousness: awake and patient cooperative  Airway & Oxygen Therapy: Patient Spontanous Breathing  Post-op Assessment: Report given to RN and Post -op Vital signs reviewed and stable  Post vital signs: Reviewed and stable  Last Vitals:  Vitals Value Taken Time  BP 85/53 06/24/24 12:26  Temp 35.9 C 06/24/24 12:27  Pulse 81 06/24/24 12:29  Resp 17 06/24/24 12:29  SpO2 97 % 06/24/24 12:29  Vitals shown include unfiled device data.  Last Pain:  Vitals:   06/24/24 1227  TempSrc: Tympanic  PainSc:          Complications: No notable events documented.

## 2024-06-24 NOTE — Progress Notes (Signed)
 This patient is not able to walk the distance required to go the bathroom, or he/she is unable to safely negotiate stairs required to access the bathroom.  A 3-in-1 BSC will alleviate this problem.

## 2024-06-24 NOTE — TOC Progression Note (Signed)
 Transition of Care [facility]) - Progression Note    Patient Details  Name: Martin Watts MRN: [id number] Date of Birth: 1945-08-07  [facility] CM/SW Contact  Racheal LITTIE Schimke, RN Phone Number: 06/24/2024, 12:59 PM  Clinical Narrative:  Surgical procedure RTKR, rolling walker ordered from [facility].                       Expected Discharge Plan and Services                                               Social Drivers of Health (SDOH) Interventions SDOH Screenings   Food Insecurity: No Food Insecurity (06/17/2024)   Received from [facility]  Housing: Low Risk  (06/17/2024)   Received from [facility]  Transportation Needs: No Transportation Needs (06/17/2024)   Received from [facility]  Utilities: Not At Risk (06/17/2024)   Received from [facility]  Financial Resource Strain: Low Risk  (06/17/2024)   Received from [facility]  Tobacco Use: Low Risk (06/24/2024)    Readmission Risk Interventions     No data to display

## 2024-06-24 NOTE — Op Note (Signed)
 06/24/2024  12:29 PM  Patient:   Martin Watts  Pre-Op Diagnosis:   Degenerative joint disease, right knee.  Post-Op Diagnosis:   Same  Procedure:   Right TKA using all-pressfit Zimmer Persona system with a #9 PCR femur, a(n) F-sized  tibial tray with a 10 mm medial congruent E-poly insert, and a 9.5 x 35 mm all-poly 3-pegged domed patella.  Surgeon:   DOROTHA Reyes Maltos, MD  Assistant:   Gustavo Level, PA-C   Anesthesia:   Spinal  Findings:   As above  Complications:   None  EBL:   10 cc  Fluids:   400 cc crystalloid  UOP:   None  TT:   80 minutes at 300 mmHg  Drains:   None  Closure:   Staples  Implants:   As above  Brief Clinical Note:   The patient is a 78 year old male with a long history of progressively worsening right knee pain. The patient's symptoms have progressed despite medications, activity modification, injections, etc. The patient's history and examination were consistent with advanced degenerative joint disease of the right knee confirmed by plain radiographs. The patient presents at this time for a right total knee arthroplasty.  Procedure:   The patient was brought into the operating room. After adequate spinal anesthesia was obtained, the patient was repositioned in the supine position on the operating room table. The right lower extremity was prepped with ChloraPrep solution and draped sterilely. Preoperative antibiotics were administered. A timeout was performed to verify the appropriate surgical site before the limb was exsanguinated with an Esmarch and the tourniquet inflated to 300 mmHg.   A standard anterior approach to the knee was made through an approximately 6-7 inch incision. The incision was carried down through the subcutaneous tissues to expose superficial retinaculum. This was split the length of the incision and the medial flap elevated sufficiently to expose the medial retinaculum. The medial retinaculum was incised, leaving a 3-4 mm cuff of  tissue on the patella. This was extended distally along the medial border of the patellar tendon and proximally through the medial third of the quadriceps tendon. A subtotal fat pad excision was performed before the soft tissues were elevated off the anteromedial and anterolateral aspects of the proximal tibia to the level of the collateral ligaments. The anterior portions of the medial and lateral menisci were removed, as was the anterior cruciate ligament. With the knee flexed to 90, the external tibial guide was positioned and the appropriate proximal tibial cut made. This piece was taken to the back table where it was measured and found to be optimally replicated by a(n) F-sized component.  Attention was directed to the distal femur. The intramedullary canal was accessed through a 3/8 drill hole. The intramedullary guide was inserted and placed at 5 of valgus alignment. Using the +0 slot, the distal cut was made. The distal femur was measured and found to be optimally replicated by the #9 component. The #9 4-in-1 cutting block was positioned and first the posterior, then the posterior chamfer, the anterior, and finally the anterior chamfer cuts were made after verifying that the anterior cortex would not be notched.   At this point, the posterior portions medial and lateral menisci were removed. A trial reduction was performed using the appropriate femoral and tibial components with the 10 mm insert. This demonstrated excellent stability to varus and valgus stressing both in flexion and extension while permitting full extension. Patellar tracking was assessed and found to be excellent. The  tibial trial position was marked on the proximal tibia. The patella thickness was measured and found to be 23 mm, so the appropriate cut was made. The patellar surface was measured and found to be optimally replicated by the 35 mm component. The three peg holes were drilled in place before the trial button was inserted.  Patella tracking was assessed and found to be excellent, passing the no thumb test. The lug holes were drilled into the distal femur before the trial component was removed.  The tibial tray was repositioned before the keel was created using the appropriate tower, drills, and punch.  The bony surfaces were prepared for implantation by irrigating them thoroughly with sterile saline solution via the jet lavage system. A bone plug was fashioned from some of the bone that had been removed previously and used to plug the distal femoral canal. In addition, a cocktail of 20 cc of Exparel , 30 cc of 0.5% Sensorcaine , 2 cc of Kenalog  40 (80 mg), and 30 mg of Toradol  diluted out to 90 cc with normal saline was injected into the postero-medial and postero-lateral aspects of the knee, the medial and lateral gutter regions, and the peri-incisional tissues to help with postoperative analgesia.    The tibial tray was impacted into place first with care taken to be sure the component was fully seated. Next, the femoral component was impacted into place again with care taken to be sure that the component was fully improperly seated. The permanent 10 mm medial congruent E-polyethylene insert was snapped into place with care taken to ensure appropriate locking of the insert. Finally, the patella was positioned and compressed into place using the patellar clamp. Again, care was taken to be sure that the component was fully seated. The knee was placed through a range of motion with the findings as described above.    The wound was copiously irrigated with sterile saline solution using the jet lavage system before the quadriceps tendon and retinacular layer were reapproximated using #0 Vicryl interrupted sutures. The superficial retinacular layer also was closed using a running #0 Vicryl suture. The subcutaneous tissues were closed in several layers using 2-0 Vicryl interrupted sutures. The skin was closed using staples. A  sterile honeycomb dressing was applied to the skin before the leg was wrapped with an Ace wrap to accommodate the Polar Care device. The patient was then awakened and returned to the recovery room in satisfactory condition after tolerating the procedure well.

## 2024-06-24 NOTE — Discharge Instructions (Addendum)
 Orthopedic discharge instructions: May shower with intact OpSite dressing. Apply ice frequently to knee or use Polar Care. Start Eliquis 1 tablet (2.5 mg) twice daily on Wednesday, 06/25/2024, for 2 weeks, then take aspirin  325 mg (or four baby (81 mg) aspirin ) twice daily for 4 weeks. Take pain medication as prescribed when needed.  May supplement with ES Tylenol  if necessary. May weight-bear as tolerated on right leg - use walker for balance and support. Follow-up in 10-14 days or as scheduled.      Information on my medicine - ELIQUIS (apixaban)  This medication education was reviewed with me or my healthcare representative as part of my discharge preparation.  The pharmacist that spoke with me during my hospital stay was:  Merrill,Kristin A, RPH  Why was Eliquis prescribed for you? Eliquis was prescribed for you to reduce the risk of blood clots forming after orthopedic surgery.    What do You need to know about Eliquis? Take your Eliquis TWICE DAILY - one tablet in the morning and one tablet in the evening with or without food.  It would be best to take the dose about the same time each day.  If you have difficulty swallowing the tablet whole please discuss with your pharmacist how to take the medication safely.  Take Eliquis exactly as prescribed by your doctor and DO NOT stop taking Eliquis without talking to the doctor who prescribed the medication.  Stopping without other medication to take the place of Eliquis may increase your risk of developing a clot.  After discharge, you should have regular check-up appointments with your healthcare provider that is prescribing your Eliquis.  What do you do if you miss a dose? If a dose of ELIQUIS is not taken at the scheduled time, take it as soon as possible on the same day and twice-daily administration should be resumed.  The dose should not be doubled to make up for a missed dose.  Do not take more than one tablet of  ELIQUIS at the same time.  Important Safety Information A possible side effect of Eliquis is bleeding. You should call your healthcare provider right away if you experience any of the following: Bleeding from an injury or your nose that does not stop. Unusual colored urine (red or dark brown) or unusual colored stools (red or black). Unusual bruising for unknown reasons. A serious fall or if you hit your head (even if there is no bleeding).  Some medicines may interact with Eliquis and might increase your risk of bleeding or clotting while on Eliquis. To help avoid this, consult your healthcare provider or pharmacist prior to using any new prescription or non-prescription medications, including herbals, vitamins, non-steroidal anti-inflammatory drugs (NSAIDs) and supplements.  This website has more information on Eliquis (apixaban): http://www.eliquis.com/eliquis/home

## 2024-06-24 NOTE — Progress Notes (Signed)
 The beneficiary has a mobility limitation that significantly impairs his/her ability to participate in one or more mobility-related activities of daily living (MRADL) in the home. The patient is able to safely use the walker. The functional mobility deficit can be sufficiently resolved by use of a rolling walker.

## 2024-06-24 NOTE — Anesthesia Preprocedure Evaluation (Signed)
"                                    Anesthesia Evaluation  Patient identified by MRN, date of birth, ID band Patient awake    Reviewed: Allergy & Precautions, H&P , NPO status , Patient's Chart, lab work & pertinent test results  Airway Mallampati: II  TM Distance: >3 FB     Dental  (+) Dental Advidsory Given, Implants   Pulmonary neg pulmonary ROS, neg shortness of breath, neg COPD          Cardiovascular hypertension, (-) angina (-) Past MI negative cardio ROS (-) dysrhythmias      Neuro/Psych negative neurological ROS  negative psych ROS   GI/Hepatic negative GI ROS, Neg liver ROS,,,  Endo/Other  diabetes, Type 2    Renal/GU      Musculoskeletal   Abdominal   Peds  Hematology negative hematology ROS (+)   Anesthesia Other Findings Past Medical History: No date: Arthritis No date: Diabetes mellitus without complication (HCC)  Past Surgical History: No date: BACK SURGERY No date: ctr No date: dental implant No date: KNEE SURGERY; Left No date: shoulder sugery  BMI    Body Mass Index:  25.82 kg/m      Reproductive/Obstetrics negative OB ROS                              Anesthesia Physical Anesthesia Plan  ASA: 2  Anesthesia Plan: Spinal   Post-op Pain Management:    Induction: Intravenous  PONV Risk Score and Plan: 2 and Ondansetron , Dexamethasone , Propofol  infusion, TIVA and Midazolam   Airway Management Planned: Natural Airway and Nasal Cannula  Additional Equipment:   Intra-op Plan:   Post-operative Plan:   Informed Consent: I have reviewed the patients History and Physical, chart, labs and discussed the procedure including the risks, benefits and alternatives for the proposed anesthesia with the patient or authorized representative who has indicated his/her understanding and acceptance.     Dental Advisory Given  Plan Discussed with: Anesthesiologist, CRNA and Surgeon  Anesthesia Plan  Comments: (Patient reports no bleeding problems and no anticoagulant use.  Plan for spinal with backup GA  Patient consented for risks of anesthesia including but not limited to:  - adverse reactions to medications - damage to eyes, teeth, lips or other oral mucosa - nerve damage due to positioning  - risk of bleeding, infection and or nerve damage from spinal that could lead to paralysis - risk of headache or failed spinal - damage to teeth, lips or other oral mucosa - sore throat or hoarseness - damage to heart, brain, nerves, lungs, other parts of body or loss of life  Patient voiced understanding and assent.)        Anesthesia Quick Evaluation  "

## 2024-06-24 NOTE — Anesthesia Procedure Notes (Signed)
 Procedure Name: MAC Date/Time: 06/24/2024 10:28 AM  Performed by: Lorrene Camelia LABOR, CRNAPre-anesthesia Checklist: Patient identified, Emergency Drugs available, Suction available and Patient being monitored Patient Re-evaluated:Patient Re-evaluated prior to induction Oxygen Delivery Method: Simple face mask Preoxygenation: Pre-oxygenation with 100% oxygen Induction Type: IV induction

## 2024-06-24 NOTE — Evaluation (Signed)
 Physical Therapy Evaluation Patient Details Name: Martin Watts MRN: [id number] DOB: 1946-03-29 Today's Date: 06/24/2024  History of Present Illness  Pt is a 78 yo M diagnosed with degenerative joint disease of the right knee and is s/p elective R TKA.  PMH includes DM, L TSA, and back surgery.  Clinical Impression  Pt was pleasant and motivated to participate during the session and put forth good effort throughout. Pt able to perform ankle DF/PF against light resistance, perform Ind BLE SLRs without extensor lag, and full available ROM seated LAQs with his RLE.  Sensation to light touch present to BLEs although pt reports feeling sensation somewhat diminished on the R foot compared to the L.  Pt required no physical assistance with sup to/from sit and presented with normal sitting balance unsupported at the EOB.  Pt was able to come to standing at the EOB with no physical assistance but quickly presented with BLE buckling in static standing with the RW requiring max A to return to sitting at the EOB safely.  Nsg and MD notified that pt not appropriate for discharge home POD#0 as planned but is expected to make good progress towards goals once BLE strength fully returns. Pt will benefit from continued PT services upon discharge to safely address deficits listed in patient problem list for decreased caregiver assistance and eventual return to PLOF.           If plan is discharge home, recommend the following: Two people to help with walking and/or transfers;A little help with bathing/dressing/bathroom;Assistance with cooking/housework;Help with stairs or ramp for entrance;Assist for transportation   Can travel by private vehicle        Equipment Recommendations Rolling walker (2 wheels);BSC/3in1  Recommendations for Other Services       Functional Status Assessment Patient has had a recent decline in their functional status and demonstrates the ability to make significant improvements in  function in a reasonable and predictable amount of time.     Precautions / Restrictions Precautions Precautions: Fall Restrictions Weight Bearing Restrictions Per Provider Order: Yes RLE Weight Bearing Per Provider Order: Weight bearing as tolerated      Mobility  Bed Mobility Overal bed mobility: Modified Independent             General bed mobility comments: Min extra time and effort but no physical assist needed with sup to/from sit    Transfers Overall transfer level: Needs assistance Equipment used: Rolling walker (2 wheels) Transfers: Sit to/from Stand Sit to Stand: Max assist           General transfer comment: Pt able to come to standing without physical assistance but after being in static standing with the RW for only 2-3 sec both of pt's LE gave way and he required max A to return safely to sitting at the EOB, nsg/MD notified    Ambulation/Gait               General Gait Details: Unable/unsafe to attempt  Stairs            Wheelchair Mobility     Tilt Bed    Modified Rankin (Stroke Patients Only)       Balance Overall balance assessment: Needs assistance   Sitting balance-Leahy Scale: Normal         Standing balance comment: Pt lacks the strength to safely stand  Pertinent Vitals/Pain Pain Assessment Pain Assessment: No/denies pain    Home Living Family/patient expects to be discharged to:: Private residence Living Arrangements: Spouse/significant other Available Help at Discharge: Family;Available 24 hours/day Type of Home: House Home Access: Stairs to enter Entrance Stairs-Rails: None Entrance Stairs-Number of Steps: 3   Home Layout: Two level;Able to live on main level with bedroom/bathroom Home Equipment: Shower seat - built in      Prior Function Prior Level of Function : Independent/Modified Independent             Mobility Comments: Ind amb community distances  without an AD, no fall history ADLs Comments: Ind with ADLs     Extremity/Trunk Assessment   Upper Extremity Assessment Upper Extremity Assessment: Overall WFL for tasks assessed    Lower Extremity Assessment Lower Extremity Assessment: RLE deficits/detail RLE Deficits / Details: Pt able to perform BLE APs against resistance with sensation to light touch intact bilaterally although decreased from baseline; able to perform Ind BLE SLR's without extensor lag RLE Sensation: decreased light touch       Communication   Communication Communication: No apparent difficulties    Cognition Arousal: Alert Behavior During Therapy: WFL for tasks assessed/performed   PT - Cognitive impairments: No apparent impairments                         Following commands: Intact       Cueing Cueing Techniques: Verbal cues, Visual cues     General Comments      Exercises Total Joint Exercises Ankle Circles/Pumps: AROM, Both, 10 reps Quad Sets: AROM, Strengthening, Right, 10 reps Straight Leg Raises: AROM, Strengthening, Both, 5 reps Long Arc Quad: AROM, Strengthening, Right, 10 reps Knee Flexion: AROM, Strengthening, Right, 10 reps Goniometric ROM: R knee AROM: 1-95 deg Other Exercises Other Exercises: RLE positioning education to promote knee ext PROM Other Exercises: HEP education for RLE QS and seated knee flex   Assessment/Plan    PT Assessment Patient needs continued PT services  PT Problem List Decreased strength;Decreased range of motion;Decreased activity tolerance;Decreased balance;Decreased mobility;Decreased knowledge of use of DME       PT Treatment Interventions DME instruction;Gait training;Stair training;Functional mobility training;Therapeutic activities;Therapeutic exercise;Balance training;Patient/family education    PT Goals (Current goals can be found in the Care Plan section)  Acute Rehab PT Goals Patient Stated Goal: To return home safely PT Goal  Formulation: With patient Time For Goal Achievement: 07/07/24 Potential to Achieve Goals: Good    Frequency BID     Co-evaluation               AM-PAC PT 6 Clicks Mobility  Outcome Measure Help needed turning from your back to your side while in a flat bed without using bedrails?: None Help needed moving from lying on your back to sitting on the side of a flat bed without using bedrails?: None Help needed moving to and from a bed to a chair (including a wheelchair)?: A Lot Help needed standing up from a chair using your arms (e.g., wheelchair or bedside chair)?: A Lot Help needed to walk in hospital room?: Total Help needed climbing 3-5 steps with a railing? : Total 6 Click Score: 14    End of Session Equipment Utilized During Treatment: Gait belt Activity Tolerance: Patient tolerated treatment well Patient left: in bed;with call bell/phone within reach;with family/visitor present Nurse Communication: Mobility status PT Visit Diagnosis: Unsteadiness on feet (R26.81);Difficulty in walking, not elsewhere classified (  R26.2);Muscle weakness (generalized) (M62.81)    Time: 8444-8368 PT Time Calculation (min) (ACUTE ONLY): 36 min   Charges:   PT Evaluation $PT Eval Moderate Complexity: 1 Mod PT Treatments $Therapeutic Exercise: 8-22 mins PT General Charges $$ ACUTE PT VISIT: 1 Visit    D. Scott Anginette Espejo PT, DPT 06/24/2024, 5:02 PM

## 2024-06-25 ENCOUNTER — Other Ambulatory Visit (HOSPITAL_COMMUNITY): Payer: Self-pay

## 2024-06-25 ENCOUNTER — Encounter: Payer: Self-pay | Admitting: Surgery

## 2024-06-25 DIAGNOSIS — M1711 Unilateral primary osteoarthritis, right knee: Secondary | ICD-10-CM | POA: Diagnosis not present

## 2024-06-25 LAB — GLUCOSE, CAPILLARY: Glucose-Capillary: 211 mg/dL — ABNORMAL HIGH (ref 70–99)

## 2024-06-25 MED ORDER — ACETAMINOPHEN 325 MG PO TABS: 325.0000 mg | ORAL_TABLET | Freq: Four times a day (QID) | ORAL | Status: AC | PRN

## 2024-06-25 NOTE — Plan of Care (Signed)
" °  Problem: Education: Goal: Knowledge of the prescribed therapeutic regimen will improve Outcome: Progressing   Problem: Bowel/Gastric: Goal: Gastrointestinal status for postoperative course will improve Outcome: Progressing   Problem: Cardiac: Goal: Ability to maintain an adequate cardiac output Outcome: Progressing Goal: Will show no evidence of cardiac arrhythmias Outcome: Progressing   Problem: Nutritional: Goal: Will attain and maintain optimal nutritional status Outcome: Progressing   Problem: Neurological: Goal: Will regain or maintain usual level of consciousness Outcome: Progressing   Problem: Clinical Measurements: Goal: Ability to maintain clinical measurements within normal limits Outcome: Progressing Goal: Postoperative complications will be avoided or minimized Outcome: Progressing   Problem: Respiratory: Goal: Will regain and/or maintain adequate ventilation Outcome: Progressing Goal: Respiratory status will improve Outcome: Progressing   Problem: Skin Integrity: Goal: Demonstrates signs of wound healing without infection Outcome: Progressing   Problem: Urinary Elimination: Goal: Will remain free from infection Outcome: Progressing Goal: Ability to achieve and maintain adequate urine output Outcome: Progressing   Problem: Education: Goal: Knowledge of General Education information will improve Description: Including pain rating scale, medication(s)/side effects and non-pharmacologic comfort measures Outcome: Progressing   Problem: Health Behavior/Discharge Planning: Goal: Ability to manage health-related needs will improve Outcome: Progressing   Problem: Clinical Measurements: Goal: Ability to maintain clinical measurements within normal limits will improve Outcome: Progressing Goal: Will remain free from infection Outcome: Progressing Goal: Diagnostic test results will improve Outcome: Progressing Goal: Respiratory complications will  improve Outcome: Progressing Goal: Cardiovascular complication will be avoided Outcome: Progressing   Problem: Activity: Goal: Risk for activity intolerance will decrease Outcome: Progressing   Problem: Nutrition: Goal: Adequate nutrition will be maintained Outcome: Progressing   Problem: Coping: Goal: Level of anxiety will decrease Outcome: Progressing   Problem: Elimination: Goal: Will not experience complications related to bowel motility Outcome: Progressing Goal: Will not experience complications related to urinary retention Outcome: Progressing   Problem: Pain Managment: Goal: General experience of comfort will improve and/or be controlled Outcome: Progressing   Problem: Safety: Goal: Ability to remain free from injury will improve Outcome: Progressing   Problem: Skin Integrity: Goal: Risk for impaired skin integrity will decrease Outcome: Progressing   Problem: Education: Goal: Ability to describe self-care measures that may prevent or decrease complications (Diabetes Survival Skills Education) will improve Outcome: Progressing Goal: Individualized Educational Video(s) Outcome: Progressing   Problem: Coping: Goal: Ability to adjust to condition or change in health will improve Outcome: Progressing   Problem: Fluid Volume: Goal: Ability to maintain a balanced intake and output will improve Outcome: Progressing   Problem: Health Behavior/Discharge Planning: Goal: Ability to identify and utilize available resources and services will improve Outcome: Progressing Goal: Ability to manage health-related needs will improve Outcome: Progressing   Problem: Metabolic: Goal: Ability to maintain appropriate glucose levels will improve Outcome: Progressing   Problem: Nutritional: Goal: Maintenance of adequate nutrition will improve Outcome: Progressing Goal: Progress toward achieving an optimal weight will improve Outcome: Progressing   Problem: Skin  Integrity: Goal: Risk for impaired skin integrity will decrease Outcome: Progressing   Problem: Tissue Perfusion: Goal: Adequacy of tissue perfusion will improve Outcome: Progressing   "

## 2024-06-25 NOTE — Progress Notes (Signed)
 Subjective: 1 Day Post-Op Procedures (LRB): ARTHROPLASTY, KNEE, TOTAL (Right) Patient reports pain as 2 on 0-10 scale.   Patient is well, and has had no acute complaints or problems Denies any CP, SOB, N/V, fevers or chills We will start therapy today.  Plan is to go Home after hospital stay.  Objective: Vital signs in last 24 hours: Temp:  [96.7 F (35.9 C)-99 F (37.2 C)] 99 F (37.2 C) (12/31 0800) Pulse Rate:  [68-106] 99 (12/31 0800) Resp:  [14-20] 16 (12/31 0800) BP: (85-133)/(53-95) 113/54 (12/31 0800) SpO2:  [96 %-100 %] 100 % (12/31 0800) Weight:  [65.8 kg] 65.8 kg (12/30 0833)  Intake/Output from previous day:  Intake/Output Summary (Last 24 hours) at 06/25/2024 0806 Last data filed at 06/24/2024 1820 Gross per 24 hour  Intake 885.06 ml  Output 810 ml  Net 75.06 ml    Intake/Output this shift: No intake/output data recorded.  Labs: No results for input(s): HGB in the last 72 hours. No results for input(s): WBC, RBC, HCT, PLT in the last 72 hours. Recent Labs    06/24/24 2146  NA 132*  K 4.9  CL 95*  CO2 23  BUN 20  CREATININE 1.00  GLUCOSE 419*  CALCIUM 9.0   No results for input(s): LABPT, INR in the last 72 hours.  EXAM General - Patient is alert, appropriate, and oriented. Extremity - Neurologically intact ABD soft Neurovascular intact Sensation intact distally Intact pulses distally Dorsiflexion/Plantar flexion intact Dressing -  dressing C/D/I. Polar care unit in place. Motor Function - intact, moving foot and toes well on exam. Straight leg raise independently.   Past Medical History:  Diagnosis Date   (HFpEF) heart failure with preserved ejection fraction (HCC)    Anginal pain    Aortic atherosclerosis    Arthritis    Ascending aorta dilatation    BPH (benign prostatic hyperplasia)    CAD (coronary artery disease)    Diabetes, polyneuropathy (HCC)    Hiatal hernia    HLD (hyperlipidemia)    HTN (hypertension)     Long-term use of aspirin  therapy    Sepsis (HCC)    SOB (shortness of breath)    Statin intolerance    T2DM (type 2 diabetes mellitus) (HCC)     Assessment/Plan: 1 Day Post-Op Procedures (LRB): ARTHROPLASTY, KNEE, TOTAL (Right) Principal Problem:   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   Weight as of this encounter: 65.8 kg. Advance diet Up with therapy D/C IV fluids Discharge home with home health  Patient will continue to work with physical therapy to pass postoperative PT protocols, ROM and strengthening  Discussed with the patient continuing to utilize Polar Care  Patient will use bone foam in 20-30 minute intervals  Patient will wear TED hose bilaterally to help prevent DVT and clot formation  Discussed the honeycomb dressing. Can be replaced with honeycomb bandages that will be sent home with the patient  Discussed sending the patient home with tramadol  and oxycodone  for as needed pain management.  Patient will also be sent home with Celebrex to help with swelling and inflammation.  Patient will take Eliquis for DVT prophylaxis  Weight-Bearing as tolerated to right leg  Cleared for discharge. Patient will follow-up with [facility] in 2 weeks for staple removal and reevaluation  Vick Dunk, PA-C [facility]  06/25/2024, 8:06 AM

## 2024-06-25 NOTE — Discharge Summary (Signed)
 " Physician Discharge Summary  Patient ID: Martin Watts MRN: [id number] DOB/AGE: [birth year] 78 y.o.  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    Past Medical History:  Diagnosis Date   (HFpEF) heart failure with preserved ejection fraction (HCC)    Anginal pain    Aortic atherosclerosis    Arthritis    Ascending aorta dilatation    BPH (benign prostatic hyperplasia)    CAD (coronary artery disease)    Diabetes, polyneuropathy (HCC)    Hiatal hernia    HLD (hyperlipidemia)    HTN (hypertension)    Long-term use of aspirin  therapy    Sepsis (HCC)    SOB (shortness of breath)    Statin intolerance    T2DM (type 2 diabetes mellitus) (HCC)       Consultants (if any):   Discharged Condition: Improved  Hospital Course: Martin Watts is an 78 y.o. 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    Surgeries: Procedures: ARTHROPLASTY, KNEE, TOTAL on 06/24/2024 Patient tolerated the surgery well. Taken to PACU where she was stabilized and then transferred to the orthopedic floor.  Started on Eliquis. Heels elevated on bed with rolled towels. No evidence of DVT. Negative Homan. Physical therapy started on day #1 for gait training and transfer. OT started day #1 for ADL and assisted devices.  Implants:  Right TKA using all-pressfit Zimmer Persona system with a #9 PCR femur, a(n) F-sized  tibial tray with  a 10 mm medial congruent E-poly insert, and a 9.5 x 35 mm all-poly 3-pegged domed patella.  He was given perioperative antibiotics:  Anti-infectives (From admission, onward)    Start     Dose/Rate Route Frequency Ordered Stop   06/24/24 2000  vancomycin  (VANCOCIN ) IVPB 1000 mg/200 mL premix        1,000 mg 200 mL/hr over 60 Minutes Intravenous Every 12 hours 06/24/24 1655 06/24/24 2257   06/24/24 0600  levofloxacin  (LEVAQUIN ) IVPB 500 mg        500 mg 100 mL/hr over 60 Minutes Intravenous On call to O.R. 06/24/24 0412 06/24/24 1132   06/24/24 0600  vancomycin  (VANCOCIN ) IVPB 1000 mg/200 mL premix        1,000 mg 200 mL/hr over 60 Minutes Intravenous On call to O.R. 06/24/24 9587 06/24/24 0946     .  He was given sequential compression devices, early ambulation, and ASA for DVT prophylaxis.  He benefited maximally from the hospital stay and there were no complications.    Recent vital signs:  Vitals:   06/25/24 0429 06/25/24 0800  BP: 107/60 (!) 113/54  Pulse: 75 99  Resp: 17 16  Temp: 98 F (36.7 C) 99 F (37.2 C)  SpO2: 98% 100%    Recent laboratory studies:  Lab Results  Component Value Date   HGB 14.4 06/11/2024   HGB 11.8 (L) 01/15/2019   HGB 13.3 01/14/2019   Lab Results  Component Value Date   WBC 10.5 06/11/2024  PLT 432 (H) 06/11/2024   No results found for: INR Lab Results  Component Value Date   NA 132 (L) 06/24/2024   K 4.9 06/24/2024   CL 95 (L) 06/24/2024   CO2 23 06/24/2024   BUN 20 06/24/2024   CREATININE 1.00 06/24/2024   GLUCOSE 419 (H) 06/24/2024    Discharge Medications:   Allergies as of 06/25/2024       Reactions   Statins Other (See Comments)   Myalgia   Cefdinir Rash        Medication List     STOP taking these medications    amoxicillin-clavulanate 875-125 MG tablet Commonly known as: AUGMENTIN   aspirin  EC 81 MG tablet   benzonatate 200 MG capsule Commonly known as: TESSALON   Repatha SureClick 140 MG/ML  Soaj Generic drug: Evolocumab       TAKE these medications    acetaminophen  500 MG tablet Commonly known as: TYLENOL  Take 1,000 mg by mouth 2 (two) times daily. What changed: Another medication with the same name was added. Make sure you understand how and when to take each.   acetaminophen  325 MG tablet Commonly known as: TYLENOL  Take 1-2 tablets (325-650 mg total) by mouth every 6 (six) hours as needed for mild pain (pain score 1-3) or fever (or temp > 100.5). What changed: You were already taking a medication with the same name, and this prescription was added. Make sure you understand how and when to take each.   apixaban 2.5 MG Tabs tablet Commonly known as: Eliquis Take 1 tablet (2.5 mg total) by mouth 2 (two) times daily.   b complex vitamins capsule Take 1 capsule by mouth daily.   CRANBERRY EXTRACT PO Take 1 tablet by mouth daily.   ezetimibe  10 MG tablet Commonly known as: ZETIA  Take 10 mg by mouth daily.   FISH OIL PO Take 1,000 mg by mouth daily.   glipiZIDE  10 MG tablet Commonly known as: GLUCOTROL  Take by mouth.  TAKE 2 TABLETS BY MOUTH TWICE A DAY BEFORE MEALS   Invokana 300 MG Tabs tablet Generic drug: canagliflozin Take 300 mg by mouth daily before breakfast.   Lantus  SoloStar 100 UNIT/ML Solostar Pen Generic drug: insulin  glargine Inject 10 Units into the skin at bedtime.   lisinopril 2.5 MG tablet Commonly known as: ZESTRIL Take 2.5 mg by mouth daily.   metFORMIN  500 MG 24 hr tablet Commonly known as: GLUCOPHAGE -XR Take 1,000 mg by mouth 2 (two) times daily with a meal.   OVER THE COUNTER MEDICATION Take 750 mg by mouth 2 (two) times daily. Green Lipped Mussel   oxyCODONE  5 MG immediate release tablet Commonly known as: Roxicodone  Take 1-2 tablets (5-10 mg total) by mouth every 4 (four) hours as needed for moderate pain (pain score 4-6) or severe pain (pain score 7-10).   PROBIOTIC PO Take 1 capsule by mouth daily. While on  antibiotic   tamsulosin  0.4 MG Caps capsule Commonly known as: FLOMAX  TAKE 1 CAPSULE BY MOUTH TWICE  DAILY   vitamin C  1000 MG tablet Take 1,000 mg by mouth daily.   Vitamin D 125 MCG Caps Take 1 capsule by mouth daily.   Xiidra 5 % Soln Generic drug: Lifitegrast Place 1 drop into both eyes 2 (two) times daily.   ZINC PO Take 50 mg by mouth daily. 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 EXAM: PORTABLE RIGHT KNEE - 1-2 VIEW COMPARISON:  None Available. FINDINGS: Right knee arthroplasty in expected alignment. No periprosthetic lucency or fracture. Recent postsurgical change includes air and edema in the soft tissues and joint space. Anterior skin staples in place. IMPRESSION: Right knee arthroplasty without immediate postoperative complication. Electronically Signed   By: Andrea Gasman M.D.   On: 06/24/2024 16:45    Disposition: Discharge disposition: 01-Home or Self Care      Cleared for discharge    Follow-up Information     Poggi, Norleen PARAS, MD Follow up.   Specialty: Orthopedic Surgery Why: follow up post op visit on 1/13 @ 1:30pm with Danton Palmateer Volpini PA PT/OT 1/13 @ 3pm with Randine Muss Contact information: [address] [facility] [place] [address] [phone]                  Signed: Vick Dunk, PA-C 06/25/2024, 8:31 AM  "

## 2024-06-25 NOTE — Progress Notes (Signed)
 All discharge requirements met.

## 2024-06-25 NOTE — TOC Transition Note (Signed)
 Transition of Care [facility]) - Discharge Note   Patient Details  Name: Martin Watts MRN: [id number] Date of Birth: [birth year]  Transition of Care [facility]) CM/SW Contact:  Lauraine JAYSON Carpen, LCSW Phone Number: 06/25/2024, 9:53 AM   Clinical Narrative:  Patient has orders to discharge home today. He was set up with [facility] prior to admission. Liaison is aware he is discharging today. TOC coworker ordered a RW yesterday but therapy also recommended a 3-in-1. Patient is agreeable. CSW ordered through [employer]. No further concerns. CSW signing off.   Final next level of care: Home w Home Health Services Barriers to Discharge: Barriers Resolved   Patient Goals and CMS Choice            Discharge Placement                    Patient and family notified of of transfer: 06/25/24  Discharge Plan and Services Additional resources added to the After Visit Summary for                  DME Arranged: Walker rolling, 3-N-1 DME Agency: [employer] Date DME Agency Contacted: 06/25/24   Representative spoke with at DME Agency: [place] HH Arranged: PT HH Agency: [facility] Date [facility] Agency Contacted: 06/25/24   Representative spoke with at [facility] Agency: [place]   Social Drivers of Health (SDOH) Interventions SDOH Screenings   Food Insecurity: No Food Insecurity (06/24/2024)  Housing: Low Risk (06/24/2024)  Transportation Needs: No Transportation Needs (06/24/2024)  Utilities: Not At Risk (06/24/2024)  Financial Resource Strain: Low Risk  (06/17/2024)   Received from [facility]  Social Connections: Socially Integrated (06/24/2024)  Tobacco Use: Low Risk (06/24/2024)     Readmission Risk Interventions     No data to display

## 2024-06-25 NOTE — Plan of Care (Signed)
 " Problem: Education: Goal: Knowledge of the prescribed therapeutic regimen will improve Outcome: Adequate for Discharge   Problem: Bowel/Gastric: Goal: Gastrointestinal status for postoperative course will improve Outcome: Adequate for Discharge   Problem: Cardiac: Goal: Ability to maintain an adequate cardiac output Outcome: Adequate for Discharge Goal: Will show no evidence of cardiac arrhythmias Outcome: Adequate for Discharge   Problem: Nutritional: Goal: Will attain and maintain optimal nutritional status Outcome: Adequate for Discharge   Problem: Neurological: Goal: Will regain or maintain usual level of consciousness Outcome: Adequate for Discharge   Problem: Clinical Measurements: Goal: Ability to maintain clinical measurements within normal limits Outcome: Adequate for Discharge Goal: Postoperative complications will be avoided or minimized Outcome: Adequate for Discharge   Problem: Respiratory: Goal: Will regain and/or maintain adequate ventilation Outcome: Adequate for Discharge Goal: Respiratory status will improve Outcome: Adequate for Discharge   Problem: Skin Integrity: Goal: Demonstrates signs of wound healing without infection Outcome: Adequate for Discharge   Problem: Urinary Elimination: Goal: Will remain Martin Watts from infection Outcome: Adequate for Discharge Goal: Ability to achieve and maintain adequate urine output Outcome: Adequate for Discharge   Problem: Education: Goal: Knowledge of General Education information will improve Description: Including pain rating scale, medication(s)/side effects and non-pharmacologic comfort measures Outcome: Adequate for Discharge   Problem: Health Behavior/Discharge Planning: Goal: Ability to manage health-related needs will improve Outcome: Adequate for Discharge   Problem: Clinical Measurements: Goal: Ability to maintain clinical measurements within normal limits will improve Outcome: Adequate for  Discharge Goal: Will remain Martin Watts from infection Outcome: Adequate for Discharge Goal: Diagnostic test results will improve Outcome: Adequate for Discharge Goal: Respiratory complications will improve Outcome: Adequate for Discharge Goal: Cardiovascular complication will be avoided Outcome: Adequate for Discharge   Problem: Activity: Goal: Risk for activity intolerance will decrease Outcome: Adequate for Discharge   Problem: Nutrition: Goal: Adequate nutrition will be maintained Outcome: Adequate for Discharge   Problem: Coping: Goal: Level of anxiety will decrease Outcome: Adequate for Discharge   Problem: Elimination: Goal: Will not experience complications related to bowel motility Outcome: Adequate for Discharge Goal: Will not experience complications related to urinary retention Outcome: Adequate for Discharge   Problem: Pain Managment: Goal: General experience of comfort will improve and/or be controlled Outcome: Adequate for Discharge   Problem: Safety: Goal: Ability to remain Martin Watts from injury will improve Outcome: Adequate for Discharge   Problem: Skin Integrity: Goal: Risk for impaired skin integrity will decrease Outcome: Adequate for Discharge   Problem: Education: Goal: Ability to describe self-care measures that may prevent or decrease complications (Diabetes Survival Skills Education) will improve Outcome: Adequate for Discharge Goal: Individualized Educational Video(s) Outcome: Adequate for Discharge   Problem: Coping: Goal: Ability to adjust to condition or change in health will improve Outcome: Adequate for Discharge   Problem: Fluid Volume: Goal: Ability to maintain a balanced intake and output will improve Outcome: Adequate for Discharge   Problem: Health Behavior/Discharge Planning: Goal: Ability to identify and utilize available resources and services will improve Outcome: Adequate for Discharge Goal: Ability to manage health-related needs  will improve Outcome: Adequate for Discharge   Problem: Metabolic: Goal: Ability to maintain appropriate glucose levels will improve Outcome: Adequate for Discharge   Problem: Nutritional: Goal: Maintenance of adequate nutrition will improve Outcome: Adequate for Discharge Goal: Progress toward achieving an optimal weight will improve Outcome: Adequate for Discharge   Problem: Skin Integrity: Goal: Risk for impaired skin integrity will decrease Outcome: Adequate for Discharge   Problem: Tissue Perfusion: Goal:  Adequacy of tissue perfusion will improve Outcome: Adequate for Discharge   "

## 2024-06-25 NOTE — Progress Notes (Signed)
 Physical Therapy Treatment Patient Details Name: Martin Watts MRN: [id number] DOB: [birth year] Today's Date: 06/25/2024   History of Present Illness Pt is a 78 yo M diagnosed with degenerative joint disease of the right knee and is s/p elective R TKA.  PMH includes DM, L TSA, and back surgery.    PT Comments  Pt was pleasant and motivated to participate during the session and put forth good effort throughout. Pt presented with much improved BLE functional strength this session with good control and stability during transfers, standing marching, and low amplitude mini squats. Pt was able to amb 125+ feet with step-to pattern initially but progressed to step-through  pattern without LOB or buckling. Pt and family participated in stair training with pt able to ascend/descend 4 steps x 2 per below with good stability and carryover of proper sequencing.  Pt will benefit from continued PT services upon discharge to safely address deficits listed in patient problem list for decreased caregiver assistance and eventual return to PLOF.      If plan is discharge home, recommend the following: Two people to help with walking and/or transfers;A little help with bathing/dressing/bathroom;Assistance with cooking/housework;Help with stairs or ramp for entrance;Assist for transportation   Can travel by private vehicle        Equipment Recommendations  Rolling walker (2 wheels);BSC/3in1    Recommendations for Other Services       Precautions / Restrictions Precautions Precautions: Fall Restrictions Weight Bearing Restrictions Per Provider Order: Yes RLE Weight Bearing Per Provider Order: Weight bearing as tolerated     Mobility  Bed Mobility               General bed mobility comments: NT, in recliner pre/post session    Transfers Overall transfer level: Needs assistance Equipment used: Rolling walker (2 wheels) Transfers: Sit to/from Stand Sit to Stand: Contact guard assist            General transfer comment: Good eccentric and concentric control and stability from multiple height surfaces    Ambulation/Gait Ambulation/Gait assistance: Contact guard assist Gait Distance (Feet): 25 Feet x 1, 125 Feet x 1 Assistive device: Rolling walker (2 wheels) Gait Pattern/deviations: Step-to pattern, Step-through pattern, Decreased step length - right, Decreased step length - left, Antalgic Gait velocity: decreased     General Gait Details: Step-to pattern initially for safety but quickly progressed to step-through pattern with no instability or buckling noted   Stairs Stairs: Yes Stairs assistance: Contact guard assist Stair Management: With walker, Backwards, Forwards, Step to pattern Number of Stairs: 4 General stair comments: Pt/family training ascending backwards and descending forwards with RW 4 steps x 2 with visual demonstration followed by practice with good group carryover of proper sequencing; steady throughout with no LOB or buckling   Wheelchair Mobility     Tilt Bed    Modified Rankin (Stroke Patients Only)       Balance Overall balance assessment: Needs assistance   Sitting balance-Leahy Scale: Normal     Standing balance support: Bilateral upper extremity supported, During functional activity, Reliant on assistive device for balance Standing balance-Leahy Scale: Good                              Communication Communication Communication: No apparent difficulties  Cognition Arousal: Alert Behavior During Therapy: WFL for tasks assessed/performed   PT - Cognitive impairments: No apparent impairments  Following commands: Intact      Cueing Cueing Techniques: Verbal cues, Visual cues, Tactile cues  Exercises Total Joint Exercises Quad Sets: AROM, Strengthening, Right, 10 reps Long Arc Quad: AROM, Strengthening, Right, 10 reps Knee Flexion: AROM, Strengthening, Right, 10 reps Goniometric  ROM: R knee AROM: 2-96 deg Marching in Standing: AROM, Strengthening, Both, 5 reps, Standing Other Exercises Other Exercises: standing low amplitude mini squats x 5 to assess BLE strength prior to amb Other Exercises: HEP education review for RLE QS and seated knee flex Other Exercises: Car transfer sequencing training Static and dynamic standing balance with reaching outside BOS with feet apart with good stability     General Comments        Pertinent Vitals/Pain Pain Assessment Pain Assessment: 0-10 Pain Score: 3  Pain Location: R knee Pain Descriptors / Indicators: Sore Pain Intervention(s): Repositioned, Ice applied, Premedicated before session, Monitored during session    Home Living                          Prior Function            PT Goals (current goals can now be found in the care plan section) Progress towards PT goals: Progressing toward goals    Frequency    BID      PT Plan      Co-evaluation              AM-PAC PT 6 Clicks Mobility   Outcome Measure  Help needed turning from your back to your side while in a flat bed without using bedrails?: None Help needed moving from lying on your back to sitting on the side of a flat bed without using bedrails?: None Help needed moving to and from a bed to a chair (including a wheelchair)?: A Little Help needed standing up from a chair using your arms (e.g., wheelchair or bedside chair)?: A Little Help needed to walk in hospital room?: A Little Help needed climbing 3-5 steps with a railing? : A Little 6 Click Score: 20    End of Session Equipment Utilized During Treatment: Gait belt Activity Tolerance: Patient tolerated treatment well Patient left: with call bell/phone within reach;with family/visitor present;in chair Nurse Communication: Mobility status PT Visit Diagnosis: Unsteadiness on feet (R26.81);Difficulty in walking, not elsewhere classified (R26.2);Muscle weakness (generalized)  (M62.81)     Time: 9054-8979 PT Time Calculation (min) (ACUTE ONLY): 35 min  Charges:    $Gait Training: 8-22 mins $Therapeutic Activity: 8-22 mins PT General Charges $$ ACUTE PT VISIT: 1 Visit                     D. Scott Terrisa Curfman PT, DPT 06/25/2024, 10:40 AM

## 2024-06-25 NOTE — Anesthesia Postprocedure Evaluation (Signed)
"   Anesthesia Post Note  Patient: Martin Watts  Procedure(s) Performed: ARTHROPLASTY, KNEE, TOTAL (Right: Knee)  Patient location during evaluation: Nursing Unit Anesthesia Type: Spinal Level of consciousness: oriented and awake and alert Pain management: pain level controlled Vital Signs Assessment: post-procedure vital signs reviewed and stable Respiratory status: spontaneous breathing and respiratory function stable Cardiovascular status: blood pressure returned to baseline and stable Postop Assessment: no headache, no backache, no apparent nausea or vomiting and patient able to bend at knees Anesthetic complications: no   No notable events documented.   Last Vitals:  Vitals:   06/25/24 0114 06/25/24 0429  BP: 112/65 107/60  Pulse: 68 75  Resp: 18 17  Temp: 36.7 C 36.7 C  SpO2: 100% 98%    Last Pain:  Vitals:   06/25/24 0525  TempSrc:   PainSc: 3                  Kashara Blocher      "

## 2024-07-22 ENCOUNTER — Ambulatory Visit: Payer: Self-pay | Admitting: Urology

## 2024-07-23 ENCOUNTER — Ambulatory Visit: Payer: Self-pay | Admitting: Urology

## 2024-07-24 ENCOUNTER — Ambulatory Visit: Payer: Self-pay | Admitting: Urology

## 2024-08-20 ENCOUNTER — Ambulatory Visit: Admitting: Urology
